# Patient Record
Sex: Female | Born: 1935 | Race: White | Hispanic: No | Marital: Married
Health system: Southern US, Community
[De-identification: ages and names within clinical notes are randomized; demographics above are authoritative.]

---

## 2003-12-06 ENCOUNTER — Ambulatory Visit: Payer: Self-pay | Admitting: Internal Medicine

## 2005-01-28 ENCOUNTER — Ambulatory Visit: Payer: Self-pay | Admitting: Internal Medicine

## 2006-04-09 ENCOUNTER — Ambulatory Visit: Payer: Self-pay | Admitting: Gastroenterology

## 2007-07-28 ENCOUNTER — Ambulatory Visit: Payer: Self-pay | Admitting: Physician Assistant

## 2008-11-17 ENCOUNTER — Ambulatory Visit: Payer: Self-pay | Admitting: Rheumatology

## 2008-11-22 ENCOUNTER — Ambulatory Visit: Payer: Self-pay | Admitting: Rheumatology

## 2008-12-18 ENCOUNTER — Ambulatory Visit: Payer: Self-pay

## 2009-01-04 ENCOUNTER — Ambulatory Visit: Payer: Self-pay | Admitting: Unknown Physician Specialty

## 2009-01-26 ENCOUNTER — Ambulatory Visit: Payer: Self-pay | Admitting: Unknown Physician Specialty

## 2009-08-06 ENCOUNTER — Ambulatory Visit: Payer: Self-pay | Admitting: Internal Medicine

## 2009-08-09 ENCOUNTER — Inpatient Hospital Stay: Payer: Self-pay | Admitting: Internal Medicine

## 2009-09-12 ENCOUNTER — Ambulatory Visit: Payer: Self-pay

## 2009-09-19 ENCOUNTER — Ambulatory Visit: Payer: Self-pay

## 2009-09-26 ENCOUNTER — Ambulatory Visit: Payer: Self-pay

## 2009-10-03 ENCOUNTER — Ambulatory Visit: Payer: Self-pay

## 2009-10-10 ENCOUNTER — Ambulatory Visit: Payer: Self-pay

## 2009-10-17 ENCOUNTER — Ambulatory Visit: Payer: Self-pay

## 2009-10-19 ENCOUNTER — Other Ambulatory Visit: Payer: Self-pay | Admitting: Internal Medicine

## 2009-10-25 ENCOUNTER — Ambulatory Visit: Payer: Self-pay | Admitting: Internal Medicine

## 2009-10-29 ENCOUNTER — Ambulatory Visit: Payer: Self-pay | Admitting: Internal Medicine

## 2009-11-05 ENCOUNTER — Ambulatory Visit: Payer: Self-pay | Admitting: Internal Medicine

## 2009-11-12 ENCOUNTER — Inpatient Hospital Stay: Payer: Self-pay | Admitting: Internal Medicine

## 2009-11-15 ENCOUNTER — Encounter: Payer: Self-pay | Admitting: Internal Medicine

## 2009-11-20 ENCOUNTER — Ambulatory Visit: Payer: Self-pay | Admitting: Internal Medicine

## 2009-11-28 ENCOUNTER — Inpatient Hospital Stay: Payer: Self-pay | Admitting: Specialist

## 2010-03-14 ENCOUNTER — Inpatient Hospital Stay: Payer: Self-pay | Admitting: Internal Medicine

## 2010-06-11 ENCOUNTER — Inpatient Hospital Stay: Payer: Self-pay | Admitting: Internal Medicine

## 2011-01-29 ENCOUNTER — Ambulatory Visit: Payer: Self-pay | Admitting: Internal Medicine

## 2011-02-14 ENCOUNTER — Inpatient Hospital Stay: Payer: Self-pay | Admitting: Internal Medicine

## 2011-02-14 LAB — COMPREHENSIVE METABOLIC PANEL
Albumin: 3.3 g/dL — ABNORMAL LOW (ref 3.4–5.0)
Anion Gap: 8 (ref 7–16)
BUN: 15 mg/dL (ref 7–18)
Bilirubin,Total: 0.3 mg/dL (ref 0.2–1.0)
Co2: 37 mmol/L — ABNORMAL HIGH (ref 21–32)
Creatinine: 1.16 mg/dL (ref 0.60–1.30)
EGFR (African American): 57 — ABNORMAL LOW
EGFR (Non-African Amer.): 47 — ABNORMAL LOW
Glucose: 167 mg/dL — ABNORMAL HIGH (ref 65–99)
Osmolality: 291 (ref 275–301)
SGPT (ALT): 13 U/L

## 2011-02-14 LAB — CK TOTAL AND CKMB (NOT AT ARMC): CK, Total: 45 U/L (ref 21–215)

## 2011-02-14 LAB — URINALYSIS, COMPLETE
Blood: NEGATIVE
Glucose,UR: NEGATIVE mg/dL (ref 0–75)
Nitrite: NEGATIVE
Ph: 7 (ref 4.5–8.0)
Squamous Epithelial: 3
WBC UR: <1 /HPF (ref 0–5)

## 2011-02-14 LAB — PROTIME-INR: INR: 1.8

## 2011-02-14 LAB — CBC
HCT: 38.8 % (ref 35.0–47.0)
HGB: 12.7 g/dL (ref 12.0–16.0)
MCH: 27.5 pg (ref 26.0–34.0)
MCV: 84 fL (ref 80–100)
Platelet: 225 10*3/uL (ref 150–440)
RBC: 4.64 10*6/uL (ref 3.80–5.20)
WBC: 8.1 10*3/uL (ref 3.6–11.0)

## 2011-02-16 LAB — CBC WITH DIFFERENTIAL/PLATELET
Eosinophil #: 0 10*3/uL (ref 0.0–0.7)
Eosinophil %: 0 %
HCT: 33.8 % — ABNORMAL LOW (ref 35.0–47.0)
Lymphocyte #: 0.4 10*3/uL — ABNORMAL LOW (ref 1.0–3.6)
MCHC: 32.6 g/dL (ref 32.0–36.0)
MCV: 85 fL (ref 80–100)
Monocyte #: 0.2 10*3/uL (ref 0.0–0.7)
Monocyte %: 1.6 %
Neutrophil %: 94.7 %
Platelet: 194 10*3/uL (ref 150–440)
RBC: 4 10*6/uL (ref 3.80–5.20)
RDW: 14.3 % (ref 11.5–14.5)
WBC: 10.5 10*3/uL (ref 3.6–11.0)

## 2011-02-16 LAB — BASIC METABOLIC PANEL
Anion Gap: 8 (ref 7–16)
BUN: 23 mg/dL — ABNORMAL HIGH (ref 7–18)
Calcium, Total: 8.7 mg/dL (ref 8.5–10.1)
Chloride: 99 mmol/L (ref 98–107)
Co2: 33 mmol/L — ABNORMAL HIGH (ref 21–32)
EGFR (African American): >60
Glucose: 168 mg/dL — ABNORMAL HIGH (ref 65–99)
Osmolality: 287 (ref 275–301)

## 2011-02-18 LAB — CBC WITH DIFFERENTIAL/PLATELET
Basophil #: 0 10*3/uL (ref 0.0–0.1)
Basophil %: 0.1 %
Eosinophil #: 0 10*3/uL (ref 0.0–0.7)
Eosinophil %: 0.1 %
HGB: 11.5 g/dL — ABNORMAL LOW (ref 12.0–16.0)
Lymphocyte %: 10.5 %
MCH: 27.9 pg (ref 26.0–34.0)
MCHC: 33.2 g/dL (ref 32.0–36.0)
MCV: 84 fL (ref 80–100)
Monocyte #: 0.8 10*3/uL — ABNORMAL HIGH (ref 0.0–0.7)
Neutrophil %: 81.3 %
Platelet: 191 10*3/uL (ref 150–440)
RDW: 14.1 % (ref 11.5–14.5)

## 2011-02-18 LAB — BASIC METABOLIC PANEL
Anion Gap: 10 (ref 7–16)
BUN: 28 mg/dL — ABNORMAL HIGH (ref 7–18)
Chloride: 98 mmol/L (ref 98–107)
Co2: 34 mmol/L — ABNORMAL HIGH (ref 21–32)
Osmolality: 289 (ref 275–301)

## 2011-02-18 LAB — PROTIME-INR: Prothrombin Time: 30.9 secs — ABNORMAL HIGH (ref 11.5–14.7)

## 2011-02-20 LAB — CULTURE, BLOOD (SINGLE)

## 2011-03-30 ENCOUNTER — Inpatient Hospital Stay: Payer: Self-pay | Admitting: Internal Medicine

## 2011-03-30 LAB — COMPREHENSIVE METABOLIC PANEL
Albumin: 3.5 g/dL (ref 3.4–5.0)
Anion Gap: 9 (ref 7–16)
Bilirubin,Total: 0.3 mg/dL (ref 0.2–1.0)
Chloride: 100 mmol/L (ref 98–107)
EGFR (Non-African Amer.): 60 — ABNORMAL LOW
Osmolality: 289 (ref 275–301)
Total Protein: 7.4 g/dL (ref 6.4–8.2)

## 2011-03-30 LAB — CBC WITH DIFFERENTIAL/PLATELET
Basophil #: 0 10*3/uL (ref 0.0–0.1)
Basophil %: 0.4 %
Eosinophil #: 0.1 10*3/uL (ref 0.0–0.7)
HGB: 11.8 g/dL — ABNORMAL LOW (ref 12.0–16.0)
Lymphocyte #: 1.3 10*3/uL (ref 1.0–3.6)
MCHC: 33.2 g/dL (ref 32.0–36.0)
MCV: 84 fL (ref 80–100)
Monocyte #: 0.9 10*3/uL — ABNORMAL HIGH (ref 0.0–0.7)
Monocyte %: 10.1 %
Neutrophil %: 73.6 %
WBC: 9.1 10*3/uL (ref 3.6–11.0)

## 2011-03-30 LAB — PROTIME-INR
INR: 1.3
Prothrombin Time: 16.6 secs — ABNORMAL HIGH (ref 11.5–14.7)

## 2011-03-30 LAB — APTT: Activated PTT: 29.9 secs (ref 23.6–35.9)

## 2011-03-30 LAB — TROPONIN I
Troponin-I: 0.03 ng/mL
Troponin-I: <0.02 ng/mL

## 2011-03-31 LAB — URINALYSIS, COMPLETE
Bacteria: NONE SEEN
Bilirubin,UR: NEGATIVE
Blood: NEGATIVE
Ketone: NEGATIVE
Nitrite: NEGATIVE
Specific Gravity: 1.01 (ref 1.003–1.030)
Squamous Epithelial: 1
WBC UR: NONE SEEN /HPF (ref 0–5)

## 2011-03-31 LAB — TROPONIN I: Troponin-I: <0.02 ng/mL

## 2011-03-31 LAB — CBC WITH DIFFERENTIAL/PLATELET
Basophil %: 0.2 %
Eosinophil #: 0 10*3/uL (ref 0.0–0.7)
HCT: 31.9 % — ABNORMAL LOW (ref 35.0–47.0)
HGB: 10.5 g/dL — ABNORMAL LOW (ref 12.0–16.0)
Lymphocyte %: 8.5 %
MCH: 27.4 pg (ref 26.0–34.0)
MCHC: 32.9 g/dL (ref 32.0–36.0)
MCV: 84 fL (ref 80–100)
Monocyte %: 2.2 %
Neutrophil %: 89.1 %
RBC: 3.82 10*6/uL (ref 3.80–5.20)
RDW: 15.9 % — ABNORMAL HIGH (ref 11.5–14.5)

## 2011-03-31 LAB — PROTIME-INR
INR: 1.4
Prothrombin Time: 17.3 secs — ABNORMAL HIGH (ref 11.5–14.7)

## 2011-03-31 LAB — COMPREHENSIVE METABOLIC PANEL
Albumin: 2.9 g/dL — ABNORMAL LOW (ref 3.4–5.0)
Alkaline Phosphatase: 98 U/L (ref 50–136)
Anion Gap: 7 (ref 7–16)
Bilirubin,Total: 0.3 mg/dL (ref 0.2–1.0)
Calcium, Total: 8.1 mg/dL — ABNORMAL LOW (ref 8.5–10.1)
Chloride: 101 mmol/L (ref 98–107)
Co2: 35 mmol/L — ABNORMAL HIGH (ref 21–32)
Creatinine: 0.91 mg/dL (ref 0.60–1.30)
Osmolality: 292 (ref 275–301)
Potassium: 4.3 mmol/L (ref 3.5–5.1)
SGOT(AST): 22 U/L (ref 15–37)
SGPT (ALT): 10 U/L — ABNORMAL LOW

## 2011-04-01 LAB — CBC WITH DIFFERENTIAL/PLATELET
Basophil #: 0 10*3/uL (ref 0.0–0.1)
Basophil %: 0 %
Eosinophil #: 0 10*3/uL (ref 0.0–0.7)
HCT: 34.4 % — ABNORMAL LOW (ref 35.0–47.0)
HGB: 11.4 g/dL — ABNORMAL LOW (ref 12.0–16.0)
Lymphocyte #: 0.4 10*3/uL — ABNORMAL LOW (ref 1.0–3.6)
MCH: 28 pg (ref 26.0–34.0)
Monocyte #: 0.4 10*3/uL (ref 0.0–0.7)
Monocyte %: 4 %
Neutrophil #: 8.5 10*3/uL — ABNORMAL HIGH (ref 1.4–6.5)
Platelet: 218 10*3/uL (ref 150–440)
RDW: 15.6 % — ABNORMAL HIGH (ref 11.5–14.5)

## 2011-04-01 LAB — BASIC METABOLIC PANEL
Anion Gap: 8 (ref 7–16)
BUN: 29 mg/dL — ABNORMAL HIGH (ref 7–18)
Calcium, Total: 8.3 mg/dL — ABNORMAL LOW (ref 8.5–10.1)
Chloride: 98 mmol/L (ref 98–107)
Creatinine: 1.13 mg/dL (ref 0.60–1.30)
Glucose: 171 mg/dL — ABNORMAL HIGH (ref 65–99)
Osmolality: 291 (ref 275–301)
Potassium: 4.2 mmol/L (ref 3.5–5.1)

## 2011-04-01 LAB — PROTIME-INR
INR: 1.9
Prothrombin Time: 22 secs — ABNORMAL HIGH (ref 11.5–14.7)

## 2011-04-02 LAB — CBC WITH DIFFERENTIAL/PLATELET
Basophil #: 0 10*3/uL (ref 0.0–0.1)
Eosinophil #: 0 10*3/uL (ref 0.0–0.7)
Eosinophil %: 0 %
HGB: 11.5 g/dL — ABNORMAL LOW (ref 12.0–16.0)
Lymphocyte #: 0.3 10*3/uL — ABNORMAL LOW (ref 1.0–3.6)
Lymphocyte %: 2.8 %
MCH: 27.6 pg (ref 26.0–34.0)
MCHC: 33.1 g/dL (ref 32.0–36.0)
Monocyte %: 3.1 %
Neutrophil #: 9.8 10*3/uL — ABNORMAL HIGH (ref 1.4–6.5)
Neutrophil %: 94 %
RBC: 4.16 10*6/uL (ref 3.80–5.20)
WBC: 10.4 10*3/uL (ref 3.6–11.0)

## 2011-04-02 LAB — BASIC METABOLIC PANEL
Anion Gap: 9 (ref 7–16)
BUN: 35 mg/dL — ABNORMAL HIGH (ref 7–18)
Calcium, Total: 8.5 mg/dL (ref 8.5–10.1)
EGFR (African American): >60
Glucose: 173 mg/dL — ABNORMAL HIGH (ref 65–99)
Osmolality: 293 (ref 275–301)
Sodium: 141 mmol/L (ref 136–145)

## 2011-04-02 LAB — URINE CULTURE

## 2011-04-02 LAB — EXPECTORATED SPUTUM ASSESSMENT W GRAM STAIN, RFLX TO RESP C

## 2011-04-02 LAB — PROTIME-INR: INR: 2.5

## 2011-04-03 LAB — BASIC METABOLIC PANEL
BUN: 37 mg/dL — ABNORMAL HIGH (ref 7–18)
Calcium, Total: 8.6 mg/dL (ref 8.5–10.1)
Co2: 36 mmol/L — ABNORMAL HIGH (ref 21–32)
Creatinine: 0.96 mg/dL (ref 0.60–1.30)
EGFR (Non-African Amer.): 58 — ABNORMAL LOW
Potassium: 3.6 mmol/L (ref 3.5–5.1)
Sodium: 141 mmol/L (ref 136–145)

## 2011-04-03 LAB — CBC WITH DIFFERENTIAL/PLATELET
Eosinophil #: 0 10*3/uL (ref 0.0–0.7)
Lymphocyte #: 0.8 10*3/uL — ABNORMAL LOW (ref 1.0–3.6)
Lymphocyte %: 8.3 %
MCH: 27.7 pg (ref 26.0–34.0)
MCV: 84 fL (ref 80–100)
Monocyte #: 0.7 10*3/uL (ref 0.0–0.7)
Neutrophil #: 8.1 10*3/uL — ABNORMAL HIGH (ref 1.4–6.5)
Platelet: 228 10*3/uL (ref 150–440)
RBC: 4.22 10*6/uL (ref 3.80–5.20)
WBC: 9.7 10*3/uL (ref 3.6–11.0)

## 2011-04-05 LAB — CULTURE, BLOOD (SINGLE)

## 2011-05-02 ENCOUNTER — Inpatient Hospital Stay: Payer: Self-pay | Admitting: Internal Medicine

## 2011-05-02 LAB — COMPREHENSIVE METABOLIC PANEL
Albumin: 3.1 g/dL — ABNORMAL LOW (ref 3.4–5.0)
Alkaline Phosphatase: 72 U/L (ref 50–136)
Anion Gap: 8 (ref 7–16)
Chloride: 100 mmol/L (ref 98–107)
Creatinine: 1 mg/dL (ref 0.60–1.30)
EGFR (African American): 59 — ABNORMAL LOW
EGFR (Non-African Amer.): 51 — ABNORMAL LOW
Potassium: 3.7 mmol/L (ref 3.5–5.1)
Sodium: 141 mmol/L (ref 136–145)
Total Protein: 7.1 g/dL (ref 6.4–8.2)

## 2011-05-02 LAB — PROTIME-INR
INR: 1.4
Prothrombin Time: 17.3 secs — ABNORMAL HIGH (ref 11.5–14.7)

## 2011-05-02 LAB — CBC
MCH: 26.8 pg (ref 26.0–34.0)
MCV: 85 fL (ref 80–100)
Platelet: 248 10*3/uL (ref 150–440)
RBC: 4.26 10*6/uL (ref 3.80–5.20)
RDW: 15 % — ABNORMAL HIGH (ref 11.5–14.5)
WBC: 10 10*3/uL (ref 3.6–11.0)

## 2011-05-03 LAB — BASIC METABOLIC PANEL
Anion Gap: 8 (ref 7–16)
BUN: 27 mg/dL — ABNORMAL HIGH (ref 7–18)
Calcium, Total: 8.6 mg/dL (ref 8.5–10.1)
Chloride: 102 mmol/L (ref 98–107)
Creatinine: 1.27 mg/dL (ref 0.60–1.30)
EGFR (African American): 44 — ABNORMAL LOW
EGFR (Non-African Amer.): 38 — ABNORMAL LOW
Glucose: 188 mg/dL — ABNORMAL HIGH (ref 65–99)
Osmolality: 297 (ref 275–301)
Potassium: 3.8 mmol/L (ref 3.5–5.1)
Sodium: 144 mmol/L (ref 136–145)

## 2011-05-03 LAB — CBC WITH DIFFERENTIAL/PLATELET
Basophil #: 0 10*3/uL (ref 0.0–0.1)
Basophil %: 0.1 %
Eosinophil %: 0.3 %
HGB: 9.8 g/dL — ABNORMAL LOW (ref 12.0–16.0)
Lymphocyte #: 1.7 10*3/uL (ref 1.0–3.6)
Lymphocyte %: 7.7 %
MCHC: 32.2 g/dL (ref 32.0–36.0)
MCV: 84 fL (ref 80–100)
Neutrophil #: 19.7 10*3/uL — ABNORMAL HIGH (ref 1.4–6.5)
RBC: 3.63 10*6/uL — ABNORMAL LOW (ref 3.80–5.20)
RDW: 15.2 % — ABNORMAL HIGH (ref 11.5–14.5)
WBC: 22.6 10*3/uL — ABNORMAL HIGH (ref 3.6–11.0)

## 2011-05-03 LAB — MAGNESIUM: Magnesium: 1.3 mg/dL — ABNORMAL LOW

## 2011-05-04 LAB — CBC WITH DIFFERENTIAL/PLATELET
Basophil #: 0 10*3/uL (ref 0.0–0.1)
Basophil %: 0.1 %
Eosinophil #: 0 10*3/uL (ref 0.0–0.7)
Lymphocyte #: 0.4 10*3/uL — ABNORMAL LOW (ref 1.0–3.6)
Lymphocyte %: 2.8 %
MCH: 27.4 pg (ref 26.0–34.0)
MCHC: 33.1 g/dL (ref 32.0–36.0)
MCV: 83 fL (ref 80–100)
Monocyte %: 1.5 %
Neutrophil %: 95.6 %
Platelet: 170 10*3/uL (ref 150–440)
RBC: 3.28 10*6/uL — ABNORMAL LOW (ref 3.80–5.20)
RDW: 15.1 % — ABNORMAL HIGH (ref 11.5–14.5)
WBC: 15.8 10*3/uL — ABNORMAL HIGH (ref 3.6–11.0)

## 2011-05-04 LAB — PROTIME-INR: Prothrombin Time: 16.7 secs — ABNORMAL HIGH (ref 11.5–14.7)

## 2011-05-04 LAB — BASIC METABOLIC PANEL
BUN: 35 mg/dL — ABNORMAL HIGH (ref 7–18)
Calcium, Total: 8.3 mg/dL — ABNORMAL LOW (ref 8.5–10.1)
Creatinine: 1.43 mg/dL — ABNORMAL HIGH (ref 0.60–1.30)
EGFR (Non-African Amer.): 33 — ABNORMAL LOW
Glucose: 160 mg/dL — ABNORMAL HIGH (ref 65–99)
Potassium: 4 mmol/L (ref 3.5–5.1)

## 2011-05-04 LAB — MAGNESIUM: Magnesium: 2.5 mg/dL — ABNORMAL HIGH

## 2011-05-05 LAB — BASIC METABOLIC PANEL
Anion Gap: 6 — ABNORMAL LOW (ref 7–16)
Calcium, Total: 8.3 mg/dL — ABNORMAL LOW (ref 8.5–10.1)
Co2: 31 mmol/L (ref 21–32)
EGFR (African American): 52 — ABNORMAL LOW
Osmolality: 291 (ref 275–301)
Potassium: 3.8 mmol/L (ref 3.5–5.1)
Sodium: 142 mmol/L (ref 136–145)

## 2011-05-05 LAB — CBC WITH DIFFERENTIAL/PLATELET
Basophil %: 0.1 %
Eosinophil #: 0 10*3/uL (ref 0.0–0.7)
Eosinophil %: 0 %
HCT: 24.8 % — ABNORMAL LOW (ref 35.0–47.0)
Lymphocyte %: 3.1 %
MCH: 27 pg (ref 26.0–34.0)
MCHC: 32.5 g/dL (ref 32.0–36.0)
Monocyte #: 0.6 x10 3/mm (ref 0.2–0.9)
Monocyte %: 5 %
Neutrophil #: 11.7 10*3/uL — ABNORMAL HIGH (ref 1.4–6.5)
Neutrophil %: 91.8 %

## 2011-05-06 LAB — CBC WITH DIFFERENTIAL/PLATELET
Basophil #: 0 10*3/uL (ref 0.0–0.1)
Eosinophil #: 0 10*3/uL (ref 0.0–0.7)
Eosinophil %: 0 %
HGB: 7.5 g/dL — ABNORMAL LOW (ref 12.0–16.0)
Lymphocyte #: 0.3 10*3/uL — ABNORMAL LOW (ref 1.0–3.6)
Lymphocyte %: 2.9 %
MCH: 28 pg (ref 26.0–34.0)
MCHC: 33.6 g/dL (ref 32.0–36.0)
Neutrophil #: 8.4 10*3/uL — ABNORMAL HIGH (ref 1.4–6.5)
Neutrophil %: 92.4 %
Platelet: 148 10*3/uL — ABNORMAL LOW (ref 150–440)
RBC: 2.69 10*6/uL — ABNORMAL LOW (ref 3.80–5.20)
RDW: 15.3 % — ABNORMAL HIGH (ref 11.5–14.5)

## 2011-05-06 LAB — BASIC METABOLIC PANEL
BUN: 20 mg/dL — ABNORMAL HIGH (ref 7–18)
Chloride: 107 mmol/L (ref 98–107)
Co2: 27 mmol/L (ref 21–32)
Creatinine: 1.03 mg/dL (ref 0.60–1.30)
Osmolality: 289 (ref 275–301)
Potassium: 4 mmol/L (ref 3.5–5.1)
Sodium: 141 mmol/L (ref 136–145)

## 2011-05-07 LAB — BASIC METABOLIC PANEL
Anion Gap: 7 (ref 7–16)
BUN: 20 mg/dL — ABNORMAL HIGH (ref 7–18)
Calcium, Total: 8.2 mg/dL — ABNORMAL LOW (ref 8.5–10.1)
Chloride: 104 mmol/L (ref 98–107)
Co2: 30 mmol/L (ref 21–32)
Creatinine: 0.89 mg/dL (ref 0.60–1.30)
EGFR (Non-African Amer.): 58 — ABNORMAL LOW
Glucose: 166 mg/dL — ABNORMAL HIGH (ref 65–99)
Potassium: 3.7 mmol/L (ref 3.5–5.1)
Sodium: 141 mmol/L (ref 136–145)

## 2011-05-07 LAB — CBC WITH DIFFERENTIAL/PLATELET
Basophil #: 0 10*3/uL (ref 0.0–0.1)
Basophil %: 0.4 %
Eosinophil #: 0 10*3/uL (ref 0.0–0.7)
HCT: 33.7 % — ABNORMAL LOW (ref 35.0–47.0)
HGB: 11.1 g/dL — ABNORMAL LOW (ref 12.0–16.0)
Lymphocyte #: 0.6 10*3/uL — ABNORMAL LOW (ref 1.0–3.6)
MCH: 27.6 pg (ref 26.0–34.0)
MCHC: 33.1 g/dL (ref 32.0–36.0)
MCV: 84 fL (ref 80–100)
Monocyte #: 0.7 x10 3/mm (ref 0.2–0.9)
Monocyte %: 6 %
Neutrophil %: 88.8 %
RBC: 4.03 10*6/uL (ref 3.80–5.20)
RDW: 15.4 % — ABNORMAL HIGH (ref 11.5–14.5)

## 2011-07-05 LAB — COMPREHENSIVE METABOLIC PANEL
Albumin: 3.9 g/dL (ref 3.4–5.0)
BUN: 18 mg/dL (ref 7–18)
Bilirubin,Total: 0.3 mg/dL (ref 0.2–1.0)
Calcium, Total: 9.7 mg/dL (ref 8.5–10.1)
Chloride: 100 mmol/L (ref 98–107)
Co2: 33 mmol/L — ABNORMAL HIGH (ref 21–32)
Creatinine: 1.13 mg/dL (ref 0.60–1.30)
EGFR (Non-African Amer.): 44 — ABNORMAL LOW
Glucose: 152 mg/dL — ABNORMAL HIGH (ref 65–99)
Osmolality: 286 (ref 275–301)
SGPT (ALT): 15 U/L
Sodium: 141 mmol/L (ref 136–145)
Total Protein: 8.2 g/dL (ref 6.4–8.2)

## 2011-07-05 LAB — CBC
HGB: 12.3 g/dL (ref 12.0–16.0)
RDW: 15.1 % — ABNORMAL HIGH (ref 11.5–14.5)
WBC: 14.3 10*3/uL — ABNORMAL HIGH (ref 3.6–11.0)

## 2011-07-05 LAB — TROPONIN I: Troponin-I: 0.04 ng/mL

## 2011-07-05 LAB — PROTIME-INR: INR: 0.9

## 2011-07-05 LAB — CK TOTAL AND CKMB (NOT AT ARMC): CK-MB: 1 ng/mL (ref 0.5–3.6)

## 2011-07-06 ENCOUNTER — Inpatient Hospital Stay: Payer: Self-pay | Admitting: Internal Medicine

## 2011-07-06 LAB — TROPONIN I: Troponin-I: 0.77 ng/mL — ABNORMAL HIGH

## 2011-07-07 LAB — CBC WITH DIFFERENTIAL/PLATELET
Basophil %: 0.1 %
Eosinophil #: 0 10*3/uL (ref 0.0–0.7)
HCT: 34.3 % — ABNORMAL LOW (ref 35.0–47.0)
MCHC: 32.4 g/dL (ref 32.0–36.0)
MCV: 86 fL (ref 80–100)
Neutrophil #: 7.1 10*3/uL — ABNORMAL HIGH (ref 1.4–6.5)
Neutrophil %: 90.5 %
RBC: 3.99 10*6/uL (ref 3.80–5.20)
RDW: 15.2 % — ABNORMAL HIGH (ref 11.5–14.5)

## 2011-07-07 LAB — BASIC METABOLIC PANEL
Anion Gap: 5 — ABNORMAL LOW (ref 7–16)
BUN: 30 mg/dL — ABNORMAL HIGH (ref 7–18)
Calcium, Total: 9.1 mg/dL (ref 8.5–10.1)
Co2: 34 mmol/L — ABNORMAL HIGH (ref 21–32)
EGFR (African American): 56 — ABNORMAL LOW
EGFR (Non-African Amer.): 48 — ABNORMAL LOW
Glucose: 173 mg/dL — ABNORMAL HIGH (ref 65–99)
Sodium: 138 mmol/L (ref 136–145)

## 2011-07-08 LAB — CBC WITH DIFFERENTIAL/PLATELET
Basophil #: 0 10*3/uL (ref 0.0–0.1)
Basophil %: 0.1 %
Eosinophil %: 0 %
Lymphocyte #: 0.5 10*3/uL — ABNORMAL LOW (ref 1.0–3.6)
MCH: 28.1 pg (ref 26.0–34.0)
MCHC: 32.2 g/dL (ref 32.0–36.0)
Monocyte #: 0.3 x10 3/mm (ref 0.2–0.9)
Monocyte %: 2.8 %
Neutrophil %: 91.9 %
Platelet: 198 10*3/uL (ref 150–440)
RBC: 3.92 10*6/uL (ref 3.80–5.20)

## 2011-07-08 LAB — URINALYSIS, COMPLETE
Bacteria: NONE SEEN
Bilirubin,UR: NEGATIVE
Glucose,UR: NEGATIVE mg/dL (ref 0–75)
Ketone: NEGATIVE
Nitrite: NEGATIVE
Specific Gravity: 1.021 (ref 1.003–1.030)
Squamous Epithelial: NONE SEEN
WBC UR: 53 /HPF (ref 0–5)

## 2011-07-08 LAB — BASIC METABOLIC PANEL
Calcium, Total: 8.3 mg/dL — ABNORMAL LOW (ref 8.5–10.1)
Chloride: 100 mmol/L (ref 98–107)
EGFR (Non-African Amer.): 51 — ABNORMAL LOW
Glucose: 170 mg/dL — ABNORMAL HIGH (ref 65–99)
Potassium: 4.7 mmol/L (ref 3.5–5.1)
Sodium: 139 mmol/L (ref 136–145)

## 2011-07-09 LAB — CBC WITH DIFFERENTIAL/PLATELET
Basophil #: 0 10*3/uL (ref 0.0–0.1)
HCT: 33 % — ABNORMAL LOW (ref 35.0–47.0)
Lymphocyte %: 7.5 %
MCHC: 32.1 g/dL (ref 32.0–36.0)
Monocyte #: 0.8 x10 3/mm (ref 0.2–0.9)
Monocyte %: 8.1 %
Neutrophil #: 7.8 10*3/uL — ABNORMAL HIGH (ref 1.4–6.5)
RDW: 15.4 % — ABNORMAL HIGH (ref 11.5–14.5)
WBC: 9.3 10*3/uL (ref 3.6–11.0)

## 2011-07-09 LAB — BASIC METABOLIC PANEL
Anion Gap: 5 — ABNORMAL LOW (ref 7–16)
BUN: 39 mg/dL — ABNORMAL HIGH (ref 7–18)
Chloride: 103 mmol/L (ref 98–107)
Creatinine: 1.17 mg/dL (ref 0.60–1.30)
EGFR (African American): 49 — ABNORMAL LOW
EGFR (Non-African Amer.): 42 — ABNORMAL LOW
Glucose: 133 mg/dL — ABNORMAL HIGH (ref 65–99)

## 2011-07-10 LAB — URINE CULTURE

## 2012-07-23 LAB — COMPREHENSIVE METABOLIC PANEL
Albumin: 2.8 g/dL — ABNORMAL LOW (ref 3.4–5.0)
Alkaline Phosphatase: 66 U/L (ref 50–136)
Anion Gap: 4 — ABNORMAL LOW (ref 7–16)
Bilirubin,Total: 0.5 mg/dL (ref 0.2–1.0)
Calcium, Total: 9.4 mg/dL (ref 8.5–10.1)
Co2: 35 mmol/L — ABNORMAL HIGH (ref 21–32)
Glucose: 100 mg/dL — ABNORMAL HIGH (ref 65–99)
Osmolality: 273 (ref 275–301)
Potassium: 3.8 mmol/L (ref 3.5–5.1)
SGOT(AST): 14 U/L — ABNORMAL LOW (ref 15–37)
Total Protein: 7.1 g/dL (ref 6.4–8.2)

## 2012-07-23 LAB — URINALYSIS, COMPLETE
Bilirubin,UR: NEGATIVE
Glucose,UR: NEGATIVE mg/dL (ref 0–75)
Hyaline Cast: 5
Leukocyte Esterase: NEGATIVE
Protein: 30
RBC,UR: 1 /HPF (ref 0–5)
Specific Gravity: 1.015 (ref 1.003–1.030)
Squamous Epithelial: <1
WBC UR: 1 /HPF (ref 0–5)

## 2012-07-23 LAB — CBC
HCT: 34.5 % — ABNORMAL LOW (ref 35.0–47.0)
MCH: 28.8 pg (ref 26.0–34.0)
MCHC: 33.6 g/dL (ref 32.0–36.0)

## 2012-07-24 ENCOUNTER — Inpatient Hospital Stay: Payer: Self-pay | Admitting: Specialist

## 2012-07-25 LAB — CBC WITH DIFFERENTIAL/PLATELET
Basophil #: 0 x10 3/mm 3
Basophil %: 0.1 %
Eosinophil #: 0 x10 3/mm 3
Eosinophil %: 0 %
HCT: 34.3 % — ABNORMAL LOW
HGB: 11.4 g/dL — ABNORMAL LOW
Lymphocyte %: 5.7 %
Lymphs Abs: 0.6 x10 3/mm 3 — ABNORMAL LOW
MCH: 28.6 pg
MCHC: 33.1 g/dL
MCV: 86 fL
Monocyte #: 0.5 "x10 3/mm "
Monocyte %: 4.2 %
Neutrophil #: 10 x10 3/mm 3 — ABNORMAL HIGH
Neutrophil %: 90 %
Platelet: 196 x10 3/mm 3
RBC: 3.98 X10 6/mm 3
RDW: 13.4 %
WBC: 11.1 x10 3/mm 3 — ABNORMAL HIGH

## 2012-07-25 LAB — BASIC METABOLIC PANEL WITH GFR
Anion Gap: 2 — ABNORMAL LOW
BUN: 31 mg/dL — ABNORMAL HIGH
Calcium, Total: 9 mg/dL
Chloride: 101 mmol/L
Co2: 34 mmol/L — ABNORMAL HIGH
Creatinine: 1.09 mg/dL
EGFR (African American): 52 — ABNORMAL LOW
EGFR (Non-African Amer.): 45 — ABNORMAL LOW
Glucose: 162 mg/dL — ABNORMAL HIGH
Osmolality: 284
Potassium: 3.9 mmol/L
Sodium: 137 mmol/L

## 2012-07-25 LAB — URINE CULTURE

## 2012-07-25 LAB — CLOSTRIDIUM DIFFICILE BY PCR

## 2012-07-28 LAB — CULTURE, BLOOD (SINGLE)

## 2012-08-06 ENCOUNTER — Ambulatory Visit: Payer: Self-pay | Admitting: Internal Medicine

## 2012-08-09 ENCOUNTER — Inpatient Hospital Stay: Payer: Self-pay | Admitting: Family Medicine

## 2012-08-09 LAB — CBC
HGB: 11 g/dL — ABNORMAL LOW (ref 12.0–16.0)
MCH: 29.2 pg (ref 26.0–34.0)
MCHC: 34 g/dL (ref 32.0–36.0)
Platelet: 263 10*3/uL (ref 150–440)
RDW: 13.8 % (ref 11.5–14.5)

## 2012-08-09 LAB — COMPREHENSIVE METABOLIC PANEL
Anion Gap: 2 — ABNORMAL LOW (ref 7–16)
Chloride: 96 mmol/L — ABNORMAL LOW (ref 98–107)
Co2: 39 mmol/L — ABNORMAL HIGH (ref 21–32)
Creatinine: 0.96 mg/dL (ref 0.60–1.30)
EGFR (Non-African Amer.): 53 — ABNORMAL LOW
Glucose: 131 mg/dL — ABNORMAL HIGH (ref 65–99)
Osmolality: 280 (ref 275–301)
Sodium: 137 mmol/L (ref 136–145)
Total Protein: 6.8 g/dL (ref 6.4–8.2)

## 2012-08-09 LAB — CK TOTAL AND CKMB (NOT AT ARMC)
CK, Total: 8 U/L — ABNORMAL LOW (ref 21–215)
CK-MB: <0.5 ng/mL — ABNORMAL LOW (ref 0.5–3.6)

## 2012-08-09 LAB — PRO B NATRIURETIC PEPTIDE: B-Type Natriuretic Peptide: 2017 pg/mL — ABNORMAL HIGH (ref 0–450)

## 2012-08-10 LAB — CBC WITH DIFFERENTIAL/PLATELET
Basophil %: 0.7 %
Eosinophil #: 0.1 10*3/uL (ref 0.0–0.7)
HGB: 10.4 g/dL — ABNORMAL LOW (ref 12.0–16.0)
Lymphocyte %: 8.5 %
MCHC: 34.1 g/dL (ref 32.0–36.0)
MCV: 86 fL (ref 80–100)
Monocyte #: 0.7 x10 3/mm (ref 0.2–0.9)
Monocyte %: 5.6 %
Neutrophil #: 10.4 10*3/uL — ABNORMAL HIGH (ref 1.4–6.5)
WBC: 12.2 10*3/uL — ABNORMAL HIGH (ref 3.6–11.0)

## 2012-08-10 LAB — BASIC METABOLIC PANEL
Anion Gap: 2 — ABNORMAL LOW (ref 7–16)
Co2: 37 mmol/L — ABNORMAL HIGH (ref 21–32)
EGFR (African American): >60
Osmolality: 277 (ref 275–301)
Sodium: 138 mmol/L (ref 136–145)

## 2012-08-11 LAB — CULTURE, BLOOD (SINGLE)

## 2012-08-12 LAB — BASIC METABOLIC PANEL
Anion Gap: 2 — ABNORMAL LOW (ref 7–16)
Chloride: 98 mmol/L (ref 98–107)
Co2: 37 mmol/L — ABNORMAL HIGH (ref 21–32)
Creatinine: 0.89 mg/dL (ref 0.60–1.30)

## 2012-08-12 LAB — CBC WITH DIFFERENTIAL/PLATELET
Basophil %: 0.1 %
Eosinophil #: 0 10*3/uL (ref 0.0–0.7)
Eosinophil %: 0 %
HCT: 31.5 % — ABNORMAL LOW (ref 35.0–47.0)
HGB: 10.6 g/dL — ABNORMAL LOW (ref 12.0–16.0)
Lymphocyte #: 0.3 10*3/uL — ABNORMAL LOW (ref 1.0–3.6)
MCH: 29.2 pg (ref 26.0–34.0)
MCHC: 33.6 g/dL (ref 32.0–36.0)
MCV: 87 fL (ref 80–100)
Monocyte #: 0.2 x10 3/mm (ref 0.2–0.9)
Neutrophil %: 95.8 %
RBC: 3.63 10*6/uL — ABNORMAL LOW (ref 3.80–5.20)

## 2012-08-12 LAB — VANCOMYCIN, TROUGH: Vancomycin, Trough: 8 ug/mL — ABNORMAL LOW (ref 10–20)

## 2012-08-13 LAB — CBC WITH DIFFERENTIAL/PLATELET
Eosinophil #: 0 10*3/uL (ref 0.0–0.7)
HCT: 31.9 % — ABNORMAL LOW (ref 35.0–47.0)
HGB: 10.6 g/dL — ABNORMAL LOW (ref 12.0–16.0)
MCH: 28.6 pg (ref 26.0–34.0)
MCHC: 33.4 g/dL (ref 32.0–36.0)
Monocyte #: 0.2 x10 3/mm (ref 0.2–0.9)
Monocyte %: 1.8 %
Neutrophil #: 11.7 10*3/uL — ABNORMAL HIGH (ref 1.4–6.5)
Neutrophil %: 95.5 %
RBC: 3.71 10*6/uL — ABNORMAL LOW (ref 3.80–5.20)
RDW: 14 % (ref 11.5–14.5)

## 2012-08-14 LAB — CREATININE, SERUM: Creatinine: 0.88 mg/dL (ref 0.60–1.30)

## 2012-08-14 LAB — VANCOMYCIN, TROUGH: Vancomycin, Trough: 16 ug/mL (ref 10–20)

## 2012-08-16 LAB — URINALYSIS, COMPLETE
Bilirubin,UR: NEGATIVE
Blood: NEGATIVE
Ketone: NEGATIVE
Nitrite: NEGATIVE
Ph: 6 (ref 4.5–8.0)
Protein: NEGATIVE
RBC,UR: 23 /HPF (ref 0–5)
Squamous Epithelial: 3

## 2012-08-16 LAB — CBC WITH DIFFERENTIAL/PLATELET
Basophil #: 0 10*3/uL (ref 0.0–0.1)
Eosinophil %: 1.1 %
HCT: 33 % — ABNORMAL LOW (ref 35.0–47.0)
HGB: 11 g/dL — ABNORMAL LOW (ref 12.0–16.0)
Lymphocyte #: 1.4 10*3/uL (ref 1.0–3.6)
MCH: 28.8 pg (ref 26.0–34.0)
MCHC: 33.5 g/dL (ref 32.0–36.0)
MCV: 86 fL (ref 80–100)
Neutrophil #: 11.2 10*3/uL — ABNORMAL HIGH (ref 1.4–6.5)
Neutrophil %: 83.9 %
Platelet: 282 10*3/uL (ref 150–440)
RBC: 3.84 10*6/uL (ref 3.80–5.20)
RDW: 14.4 % (ref 11.5–14.5)
WBC: 13.4 10*3/uL — ABNORMAL HIGH (ref 3.6–11.0)

## 2012-08-16 LAB — BASIC METABOLIC PANEL
Anion Gap: 0 — ABNORMAL LOW (ref 7–16)
BUN: 22 mg/dL — ABNORMAL HIGH (ref 7–18)
Calcium, Total: 9.1 mg/dL (ref 8.5–10.1)
Co2: 42 mmol/L (ref 21–32)
Creatinine: 0.75 mg/dL (ref 0.60–1.30)
EGFR (African American): >60
EGFR (Non-African Amer.): >60
Osmolality: 278 (ref 275–301)
Potassium: 4.1 mmol/L (ref 3.5–5.1)
Sodium: 137 mmol/L (ref 136–145)

## 2012-08-16 LAB — PROTEIN / CREATININE RATIO, URINE
Creatinine, Urine: 83.5 mg/dL (ref 30.0–125.0)
Protein, Random Urine: 31 mg/dL — ABNORMAL HIGH (ref 0–12)
Protein/Creat. Ratio: 371 mg/gCREAT — ABNORMAL HIGH (ref 0–200)

## 2012-08-16 LAB — CHLORIDE, URINE, RANDOM: Chloride, Urine Random: 117 mmol/L (ref 55–125)

## 2012-08-17 LAB — VANCOMYCIN, TROUGH: Vancomycin, Trough: 23 ug/mL (ref 10–20)

## 2012-08-17 LAB — CREATININE, SERUM
Creatinine: 0.87 mg/dL (ref 0.60–1.30)
EGFR (African American): >60
EGFR (Non-African Amer.): 59 — ABNORMAL LOW

## 2012-08-17 LAB — COMPREHENSIVE METABOLIC PANEL
Alkaline Phosphatase: 77 U/L (ref 50–136)
Anion Gap: 1 — ABNORMAL LOW (ref 7–16)
BUN: 20 mg/dL — ABNORMAL HIGH (ref 7–18)
Bilirubin,Total: 0.6 mg/dL (ref 0.2–1.0)
Calcium, Total: 9.2 mg/dL (ref 8.5–10.1)
Chloride: 94 mmol/L — ABNORMAL LOW (ref 98–107)
Co2: 41 mmol/L (ref 21–32)
Osmolality: 275 (ref 275–301)
Potassium: 3.9 mmol/L (ref 3.5–5.1)
SGPT (ALT): 14 U/L (ref 12–78)
Total Protein: 6.1 g/dL — ABNORMAL LOW (ref 6.4–8.2)

## 2012-08-17 LAB — CULTURE, BLOOD (SINGLE)

## 2012-08-18 LAB — COMPREHENSIVE METABOLIC PANEL
Albumin: 2.4 g/dL — ABNORMAL LOW (ref 3.4–5.0)
BUN: 18 mg/dL (ref 7–18)
Bilirubin,Total: 0.6 mg/dL (ref 0.2–1.0)
Calcium, Total: 9.2 mg/dL (ref 8.5–10.1)
EGFR (African American): >60
EGFR (Non-African Amer.): >60
Glucose: 111 mg/dL — ABNORMAL HIGH (ref 65–99)
Osmolality: 275 (ref 275–301)
Potassium: 3.9 mmol/L (ref 3.5–5.1)
Total Protein: 6.1 g/dL — ABNORMAL LOW (ref 6.4–8.2)

## 2012-08-18 LAB — PROTEIN ELECTROPHORESIS(ARMC)

## 2012-08-18 LAB — KAPPA/LAMBDA FREE LIGHT CHAINS (ARMC)

## 2012-08-19 LAB — VANCOMYCIN, TROUGH: Vancomycin, Trough: 11 ug/mL (ref 10–20)

## 2012-08-19 LAB — BASIC METABOLIC PANEL
Anion Gap: 1 — ABNORMAL LOW (ref 7–16)
Chloride: 99 mmol/L (ref 98–107)
EGFR (Non-African Amer.): >60
Glucose: 107 mg/dL — ABNORMAL HIGH (ref 65–99)
Osmolality: 281 (ref 275–301)
Sodium: 140 mmol/L (ref 136–145)

## 2012-08-19 LAB — CBC WITH DIFFERENTIAL/PLATELET
Basophil #: 0 10*3/uL (ref 0.0–0.1)
Basophil %: 0.4 %
Eosinophil #: 0.4 10*3/uL (ref 0.0–0.7)
Eosinophil %: 4.3 %
HCT: 28.8 % — ABNORMAL LOW (ref 35.0–47.0)
Lymphocyte #: 0.9 10*3/uL — ABNORMAL LOW (ref 1.0–3.6)
Lymphocyte %: 9.8 %
MCH: 29.2 pg (ref 26.0–34.0)
MCHC: 33.8 g/dL (ref 32.0–36.0)
MCV: 86 fL (ref 80–100)
Monocyte #: 0.7 x10 3/mm (ref 0.2–0.9)
Neutrophil #: 7.4 10*3/uL — ABNORMAL HIGH (ref 1.4–6.5)
Neutrophil %: 78.4 %
Platelet: 234 10*3/uL (ref 150–440)
RBC: 3.34 10*6/uL — ABNORMAL LOW (ref 3.80–5.20)
RDW: 14.5 % (ref 11.5–14.5)
WBC: 9.4 10*3/uL (ref 3.6–11.0)

## 2012-09-06 ENCOUNTER — Ambulatory Visit: Payer: Self-pay | Admitting: Internal Medicine

## 2012-09-06 DEATH — deceased

## 2014-02-22 IMAGING — CR DG FOOT COMPLETE 3+V*L*
1 series · 3 of 3 positions shown · non-contrast
Comparison: none

REASON FOR EXAM: left calcaneal swelling and erythema and tenderness
COMMENTS:

[Series 1: ap · 0.17mm/px · 3 of 3 slices shown]
[im 1/3]
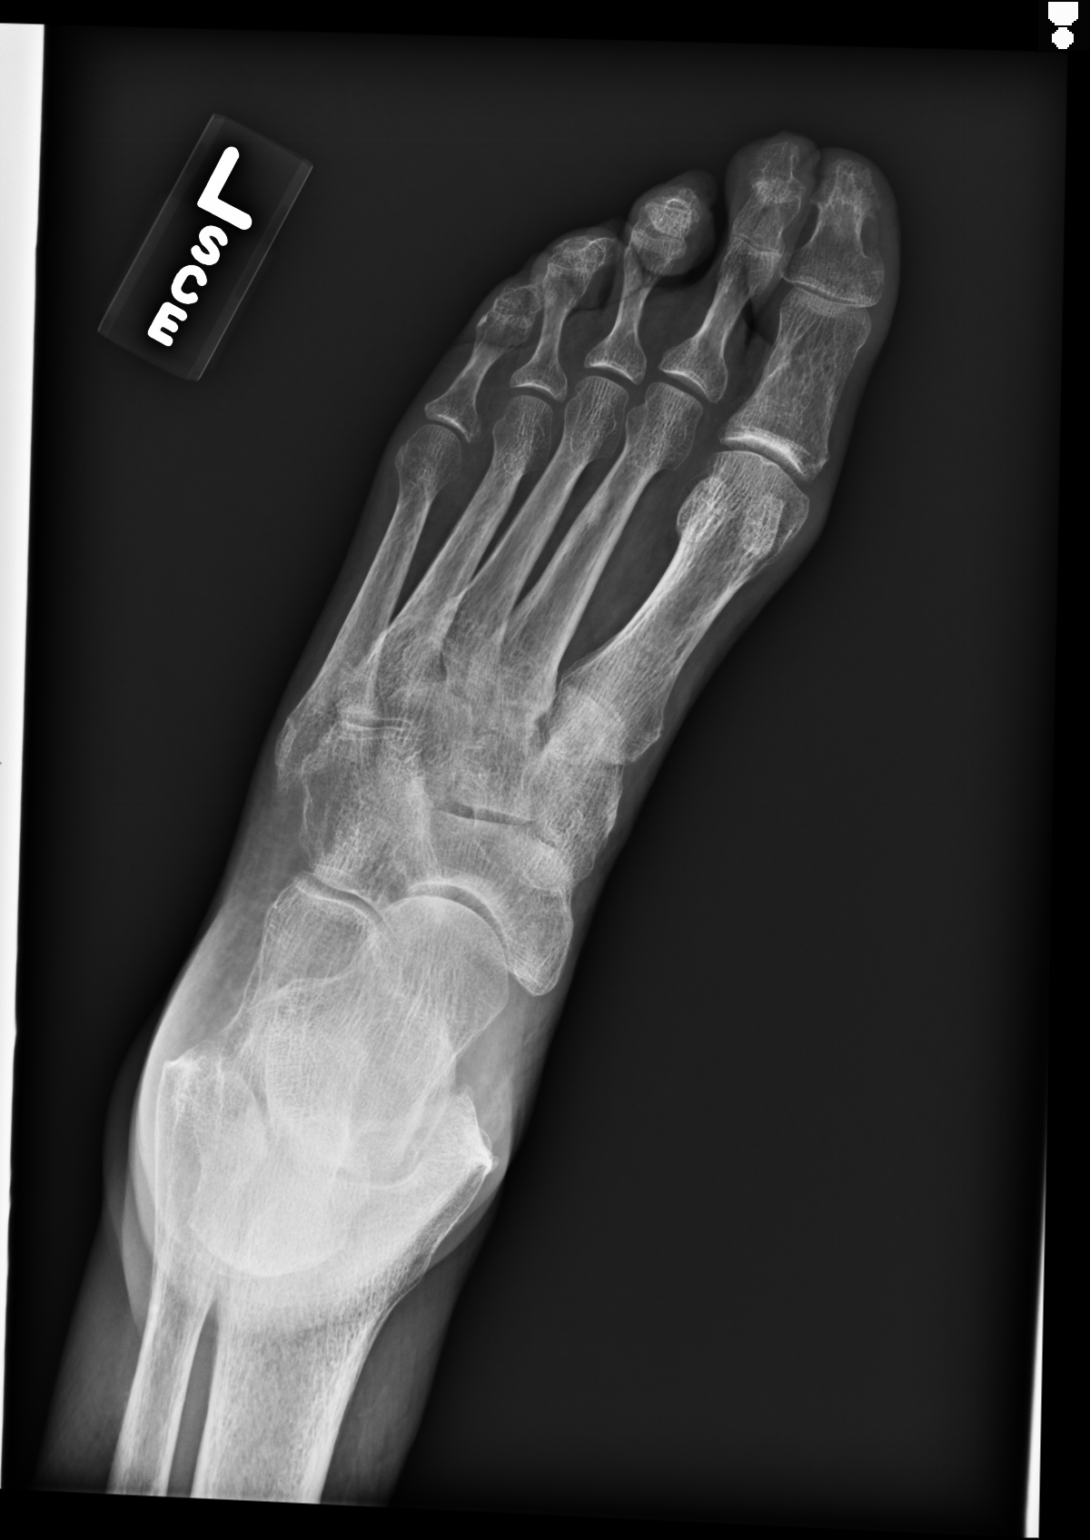
[im 2/3]
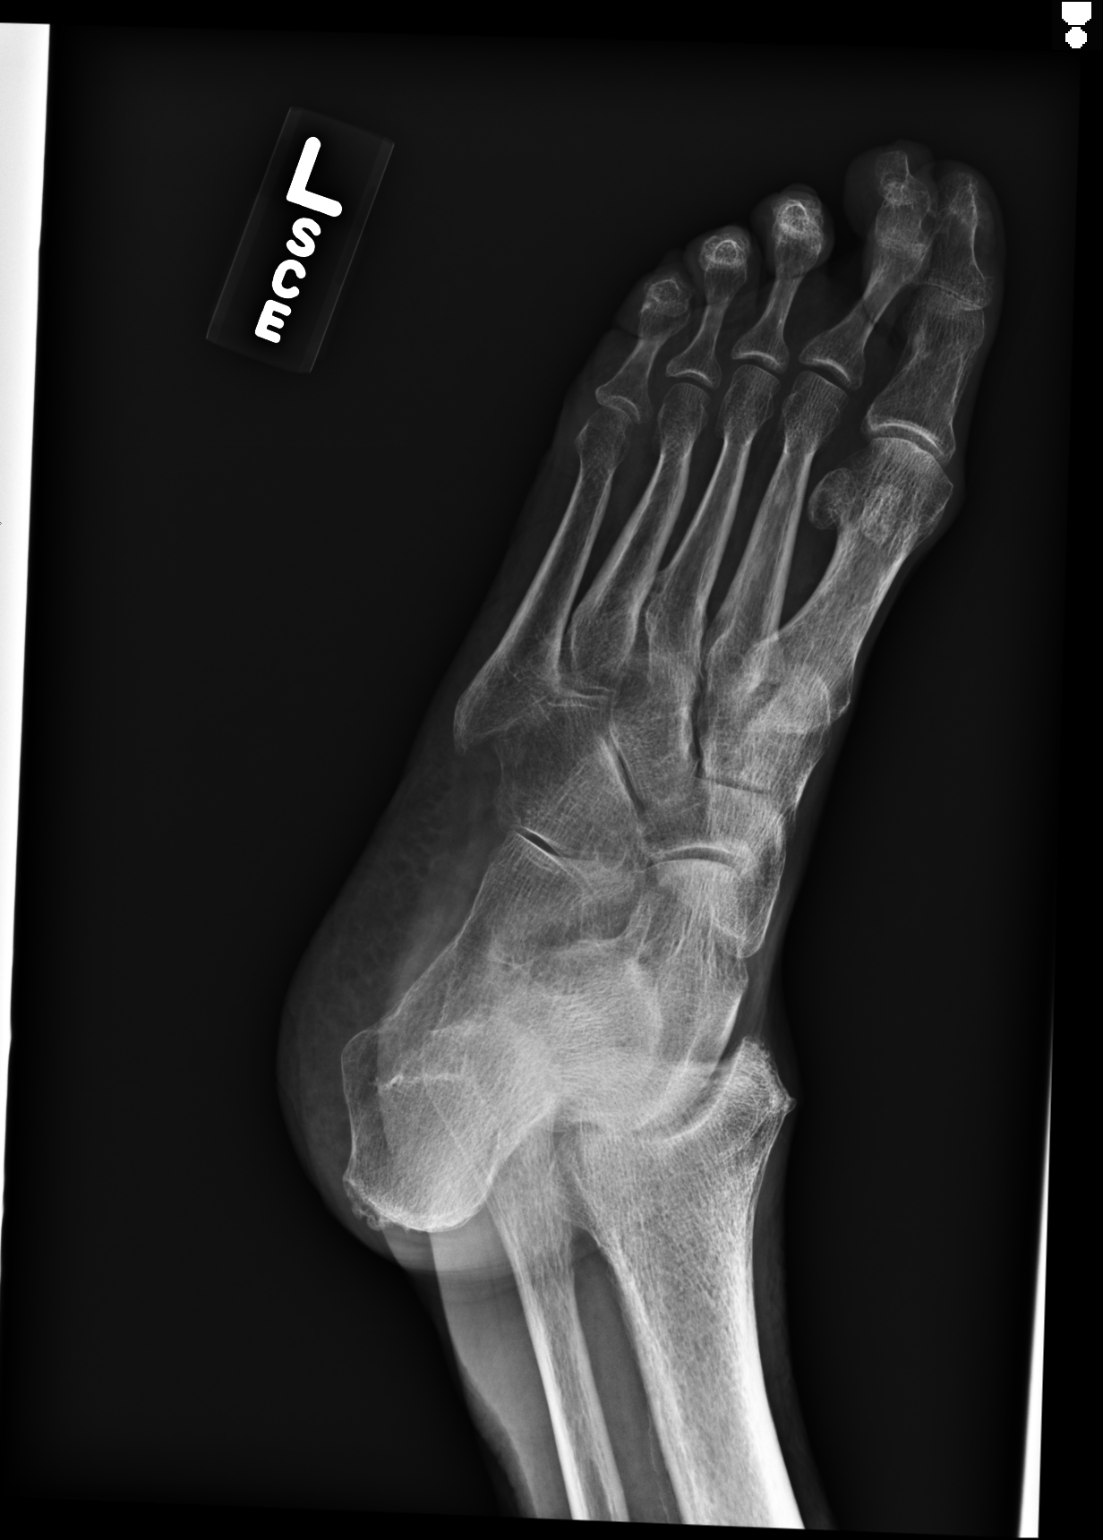
[im 3/3]
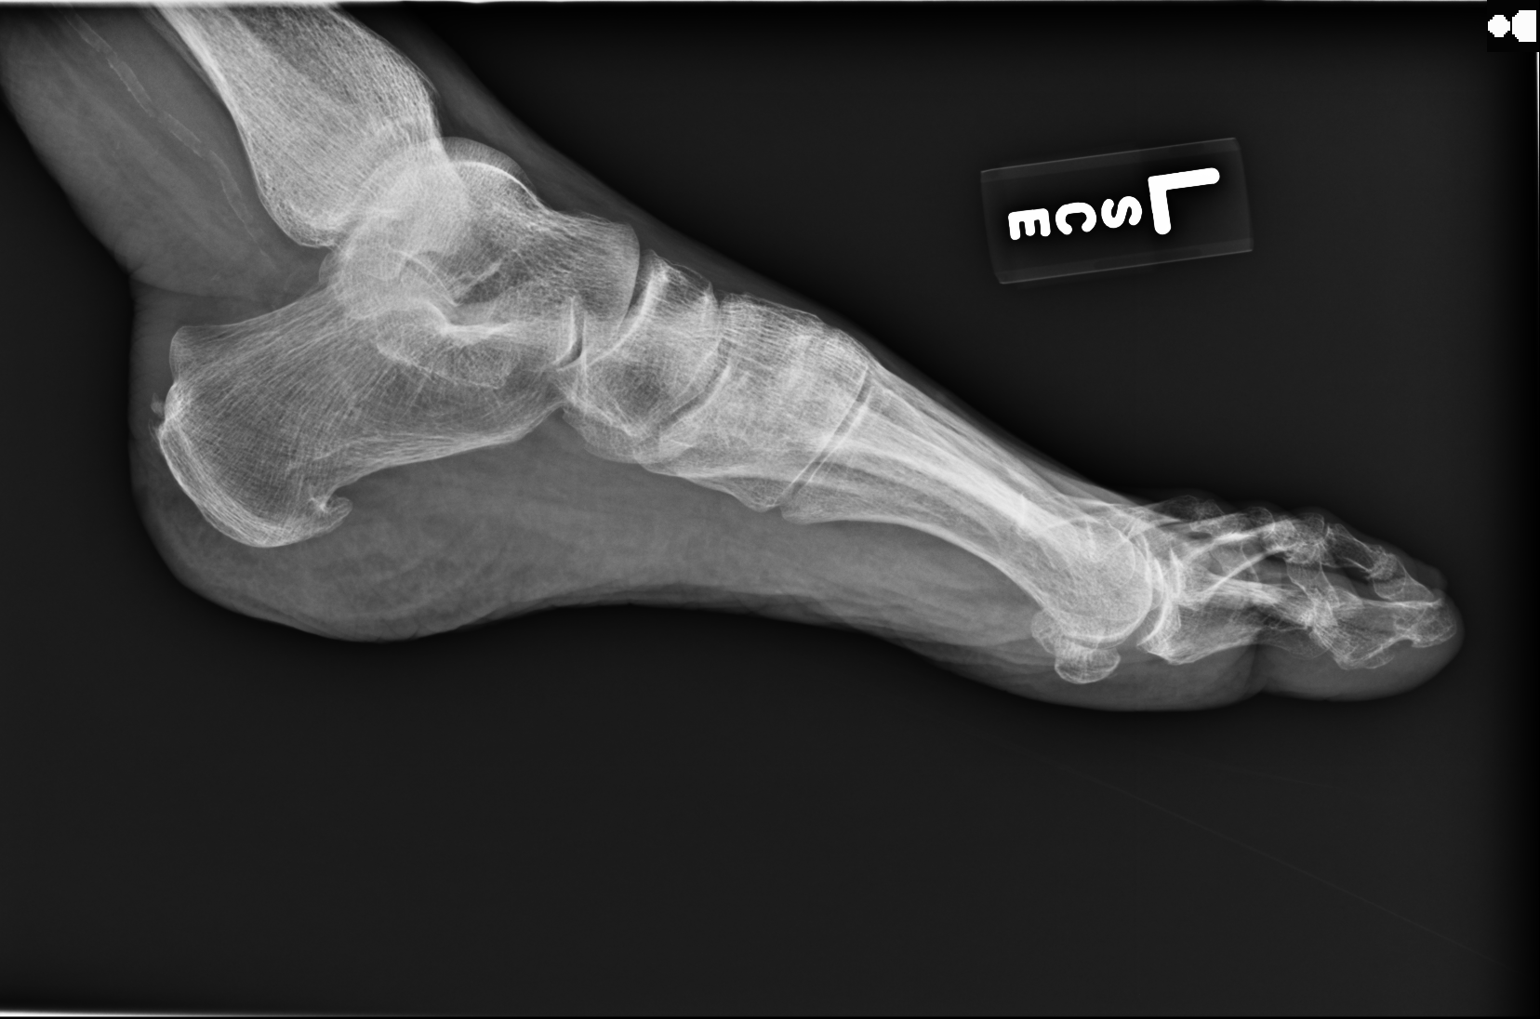

[3 of 3 positions shown; findings below may reference images not displayed]

PROCEDURE:     DXR - DXR FOOT LT COMP W/OBLIQUES  - August 10, 2012  [DATE]

RESULT:     Left foot images demonstrate enter phalangeal degenerative
changes. There are prominent metatarsal phalangeal and tarsometatarsal
degenerative changes present as well. There is no bony destruction or
fracture. Plantar spurring is seen in the calcaneus.
IMPRESSION: 1. Chronic changes. No acute bony abnormality evident.

[REDACTED]

## 2014-02-26 IMAGING — CR DG CHEST 1V PORT
1 series · 1 of 1 positions shown · non-contrast
Comparison: none

REASON FOR EXAM: insertion of PICC- right posterior oblique view
COMMENTS:

[ap]
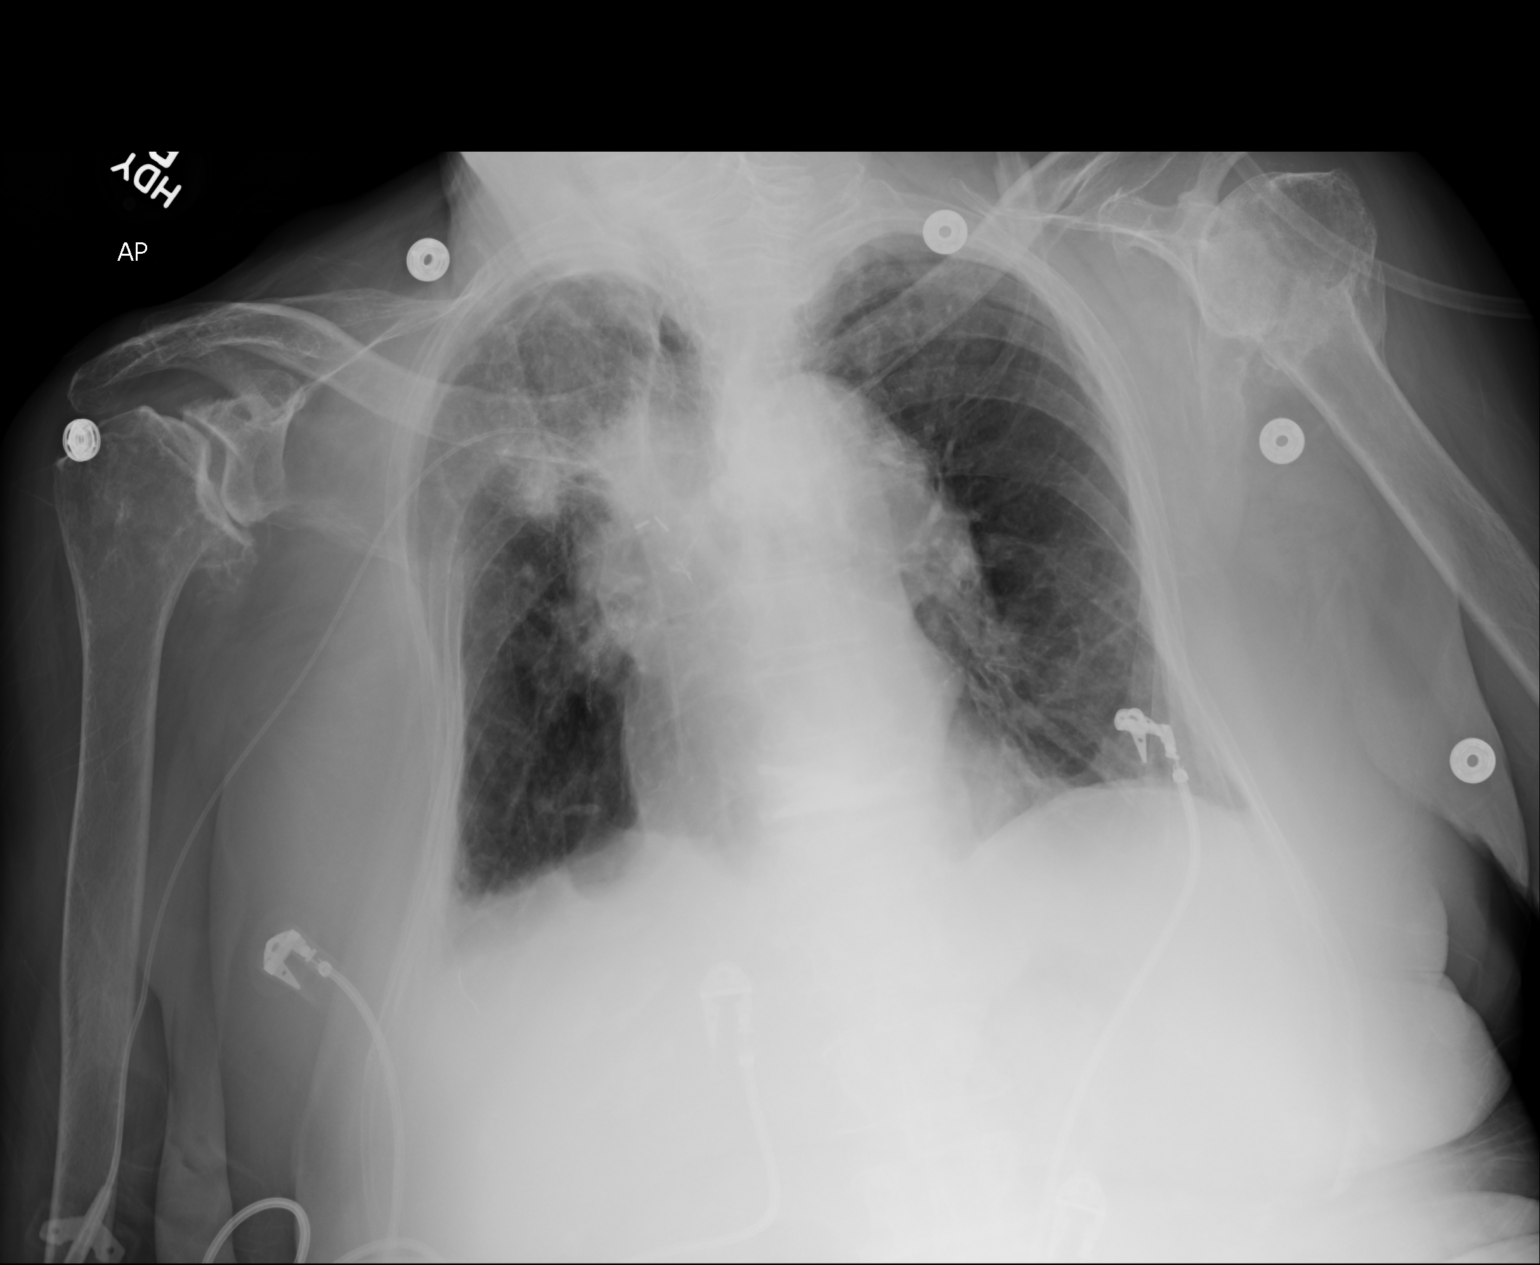

[1 of 1 positions shown; findings below may reference images not displayed]

PROCEDURE:     DXR - DXR PORTABLE CHEST SINGLE VIEW  - August 14, 2012  [DATE]

RESULT:     Comparison is made to the study August 14, 2012.

The lungs are slightly less well inflated today. There remains increased
density in the right upper hemithorax. The PICC line has been advanced such
that its tip now lies at the junction of the SVC with the right atrium.
Stable increased density in the right upper hemithorax is present. Chronic
abnormalities of both shoulders are present.
IMPRESSION: There has been interval advancement of the PICC line such
that its tip now lies in the region of the junction of the SVC with the
right atrium.

[REDACTED]

## 2014-02-27 IMAGING — CT CT HEAD WITHOUT CONTRAST
1 series · 15 of 30 positions shown, 19 images · non-contrast
Comparison: none

REASON FOR EXAM: drowsy
COMMENTS:

[Series 2: soft tissue · axial · 0.42mm/px · z∈[-130,+5]mm · 15 of 31 slices shown, 19 images]
[im 2/31  brain]
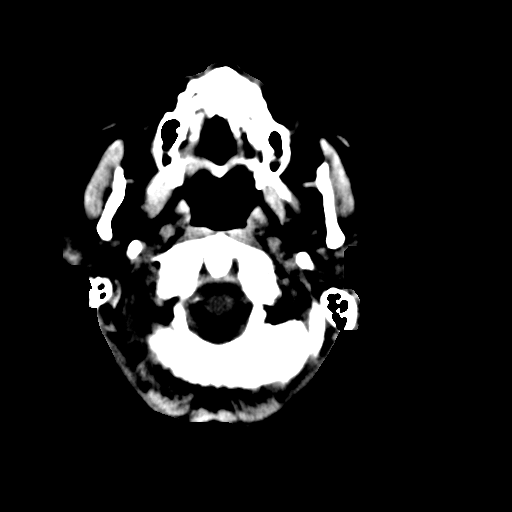
[im 2/31  bone]
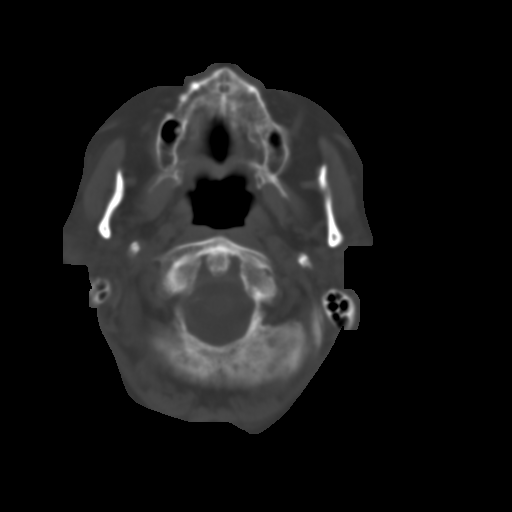
[im 4/31  brain]
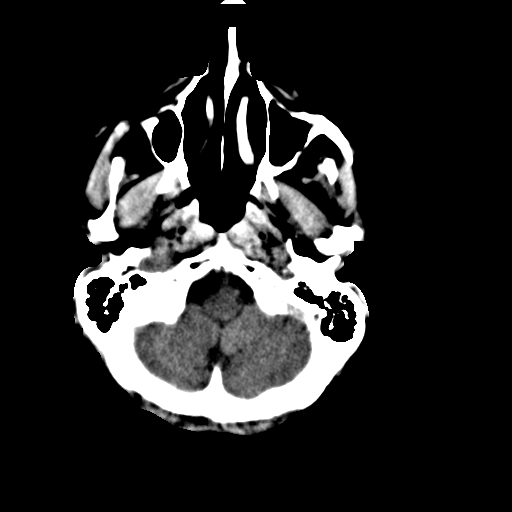
[im 6/31  brain]
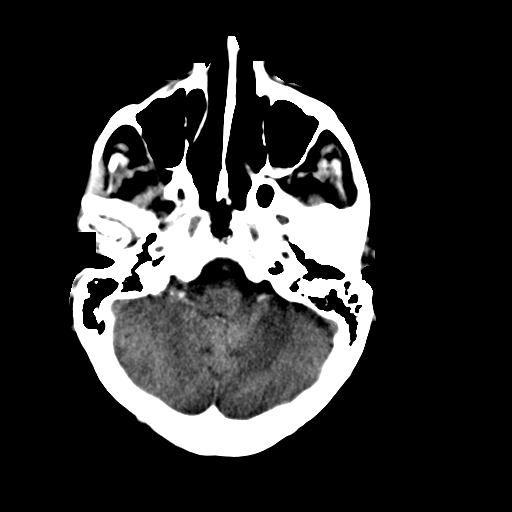
[im 8/31  brain]
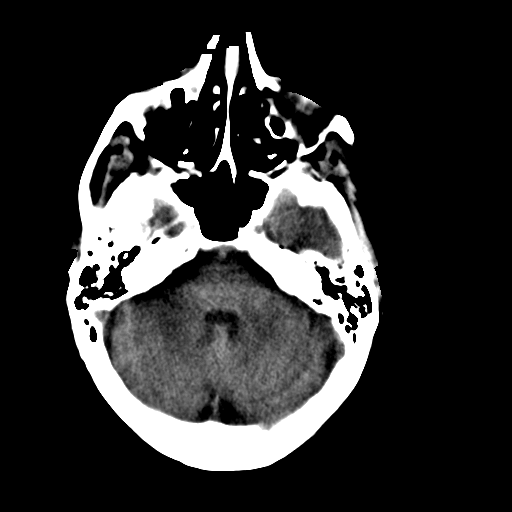
[im 10/31  brain]
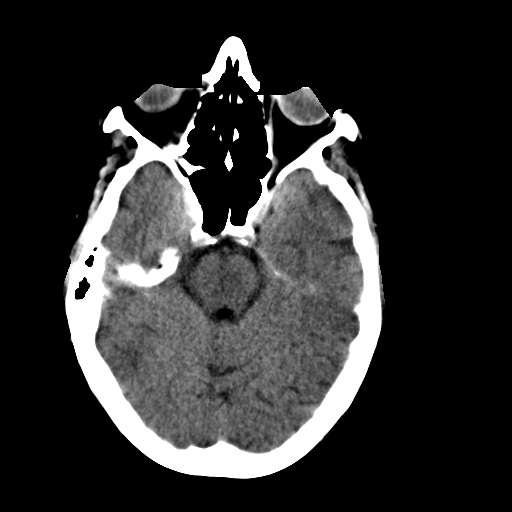
[im 10/31  bone]
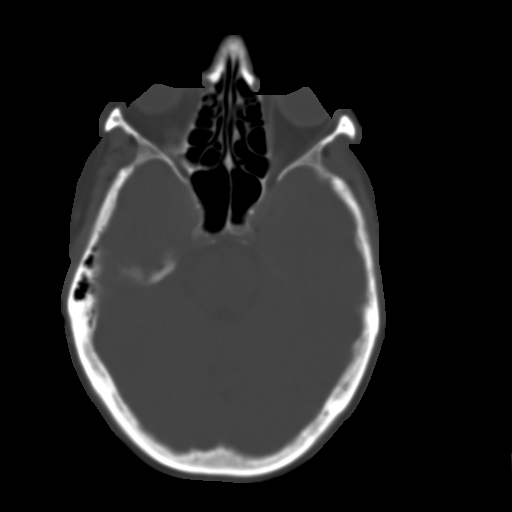
[im 12/31  brain]
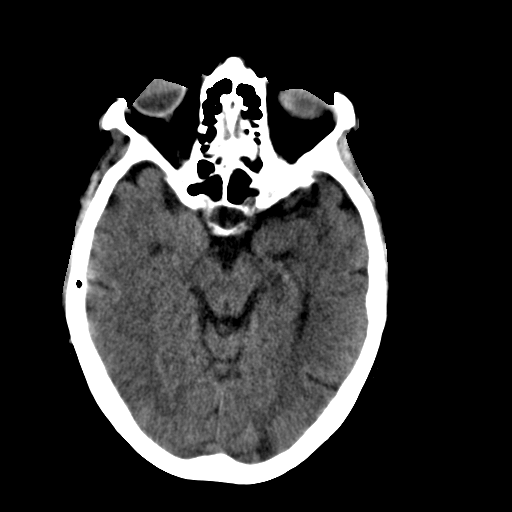
[im 14/31  brain]
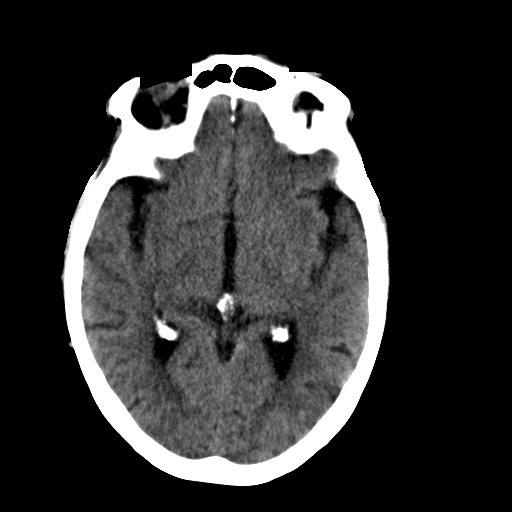
[im 16/31  brain]
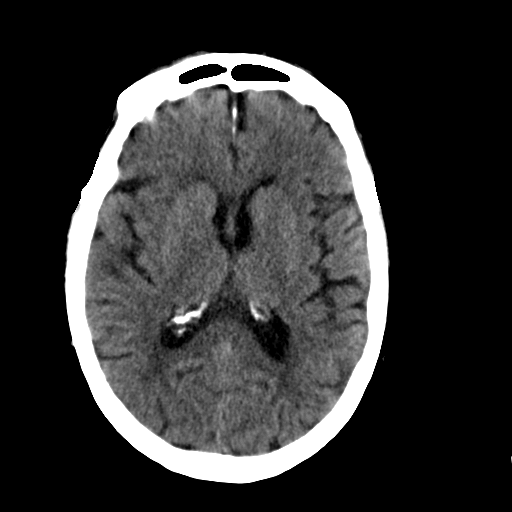
[im 17/31  brain]
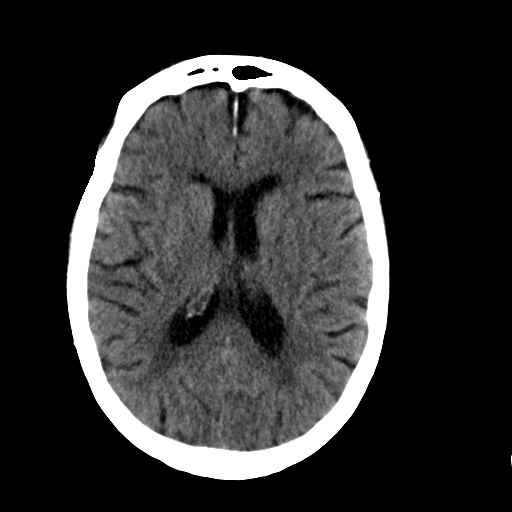
[im 17/31  bone]
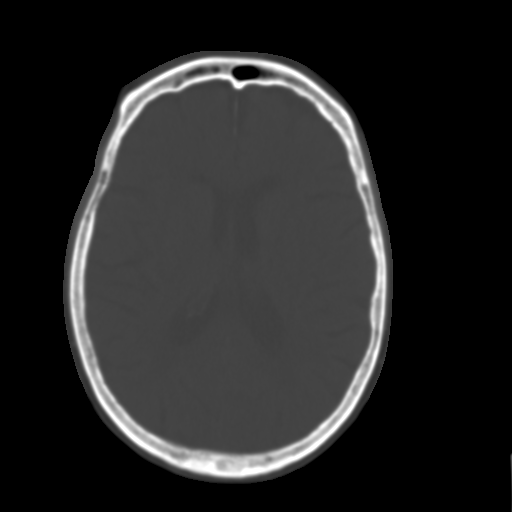
[im 19/31  brain]
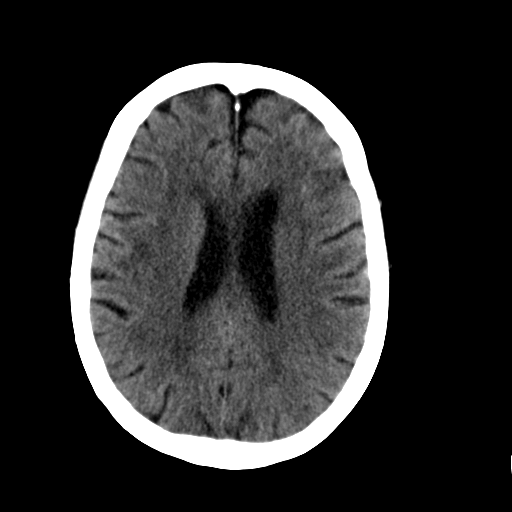
[im 21/31  brain]
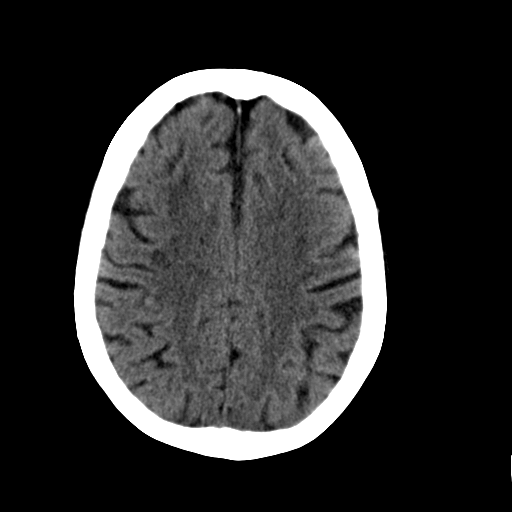
[im 23/31  brain]
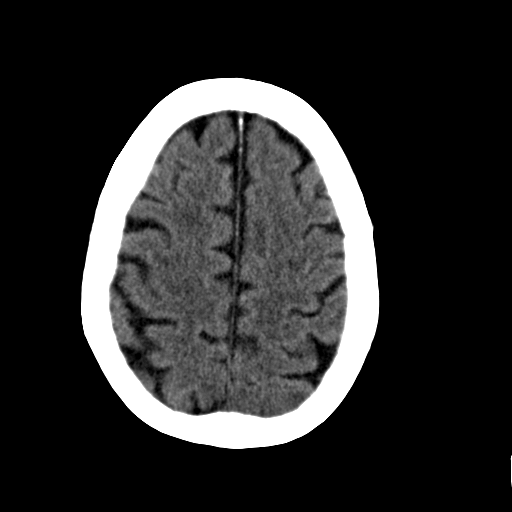
[im 25/31  brain]
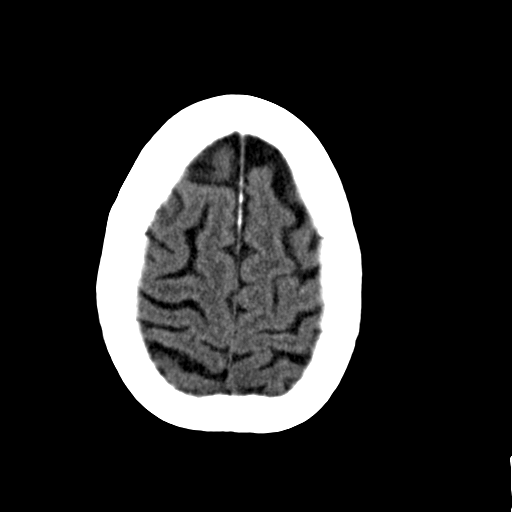
[im 25/31  bone]
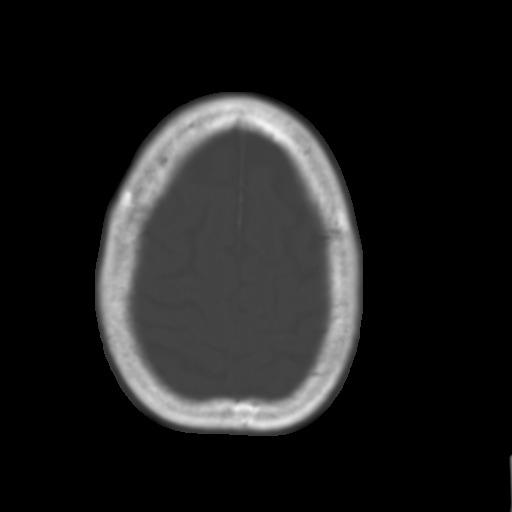
[im 27/31  brain]
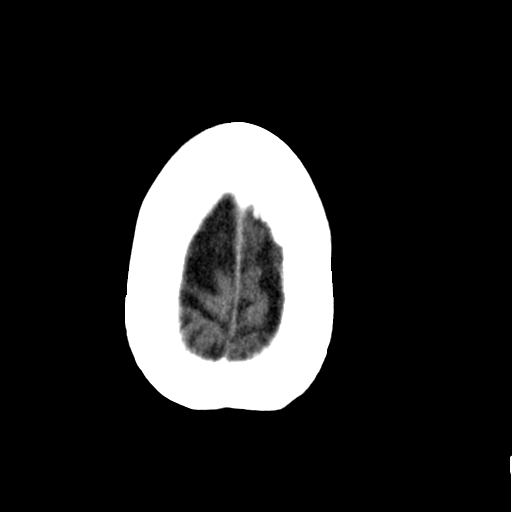
[im 29/31  brain]
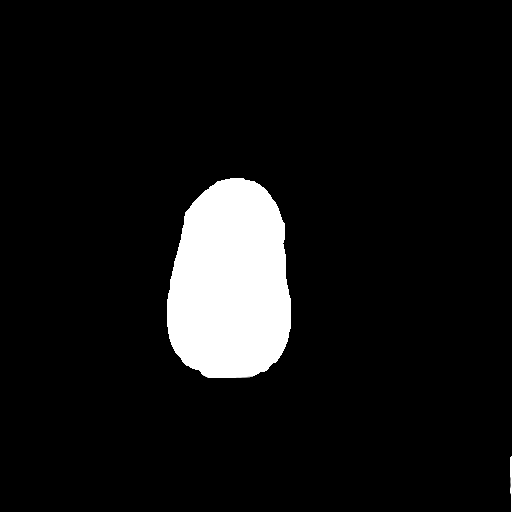

[15 of 30 positions shown; findings below may reference images not displayed]

PROCEDURE:     CT  - CT HEAD WITHOUT CONTRAST  - August 15, 2012  [DATE]

RESULT:     Axial noncontrast CT scanning was performed through the brain
with reconstructions at 5 mm intervals and slice thicknesses.

There is mild age-appropriate diffuse cerebral and cerebellar atrophy with
compensatory ventriculomegaly. There is no shift of the midline. There is
decreased density in the deep white matter of both cerebral hemispheres
consistent with chronic small vessel ischemic change. There is no evidence
of an acute ischemic or hemorrhagic infarction. There is evidence of an old
infarct in the left parietal lobe on image 13.

At bone window settings there is a small amount of mucoperiosteal thickening
and a trace of fluid posteriorly in the right maxillary sinus. The remainder
of the paranasal sinuses and mastoid air cells are clear. There is no
evidence of an acute skull fracture.
IMPRESSION: 1. There is no evidence of an acute ischemic or hemorrhagic infarction.
2. There is an old infarct in the left posterior parietal lobe. There is
decreased density in the deep white matter of both cerebral hemispheres
consistent with chronic small vessel ischemic type change.
3. There may be mild inflammatory change of the left maxillary sinus.

[REDACTED]

## 2014-04-28 NOTE — Discharge Summary (Signed)
PATIENT NAME:  Gina Potts, Gina Potts MR#:  [id number] DATE OF BIRTH:  1921-07-16  DATE OF ADMISSION:  07/24/2012 DATE OF DISCHARGE:  07/27/2012  For a detailed note, please take a look at the history and physical done on admission by Dr. Rudene Rearwish.   DISCHARGE DIAGNOSIS: 1.  Chronic obstructive pulmonary disease exacerbation, likely secondary to acute bronchitis. 2.  Acute bronchitis.  3.  Acute diarrhea, likely related to Levaquin, now resolved. 4.  History of lung cancer.  5.  Osteoarthritis.  6.  Diabetes.  7.  Anxiety.  8.  Hypertension.   DISCHARGE DIET: The patient is being discharged on a low-sodium, American Diabetic Association Diet.   DISCHARGE ACTIVITY: As tolerated.   DISCHARGE FOLLOWUP:  Is with Dr. Terance HartBronstein in the next 1 to 2 weeks.   DISCHARGE MEDICATIONS:  1.  Nitroglycerin patch 0.4 mg daily. 2.  DuoNebs q. 2 hours as needed. 3.  Ativan 0.5 mg q. 4 hours as needed for anxiety. 4.  Oxygen 3 liters nasal cannula continuously. 5.  Spiriva 1 puff daily. 6.  Singular 10 mg daily. 7.  Advair 500/50 one puff b.i.d. 8.  Prednisone 7.5 mg daily on Monday, Wednesday and Friday and prednisone 5 mg on Sunday, Tuesday, Thursday and Saturday. These are not to be initiated until she finishes her current prednisone taper. 9.  Potassium 10 mEq daily. 10.  Vitamin B12 1000 mcg daily. 11.  Calcium with vitamin D 1 tab b.i.d. 12.  Cozaar 100 mg daily. 13.  Metoprolol tartrate 75 mg b.i.d. 14.  Tylenol 650 mg q. 4 hours as needed.  15.  Vitamin D3 1000 international units daily. 16.  Voltaren topical gel t.i.d. to left hip. 17.  DuoNebs 4 times daily, scheduled. 18.  Prednisone taper starting at 60 mg down to 10 mg over the next 12 days. 19.  Dextromethorphan/guaifenesin 10 mL q. 4 hours as needed for cough. 20.  Zithromax 500 mg daily x 5 days.   PERTINENT STUDIES DONE DURING THE HOSPITAL COURSE: A chest x-ray done on admission showing no acute cardiopulmonary disease. Blood cultures  noted to be negative. Urine cultures are also negative. Stool for C. diff comprehensive culture also negative.   BRIEF HOSPITAL COURSE: This is an 81 year old female with medical problems as mentioned above who presented to the hospital due to her shortness of breath, cough and low-grade fever.  1.  Acute febrile illness. The patient presented to the hospital with shortness of breath, cough and low-grade fevers of 100.4. She had a chest x-ray done on admission which was negative for pneumonia. She was empirically started on IV Levaquin, also IV steroids, around-the-clock nebulizer treatments and maintained on inhalers. The likely cause of her febrile illness was probably acute bronchitis. She did not tolerate the Levaquin well. She developed some diarrhea. Therefore, it was switched to Zithromax. She is currently being discharged on a prednisone taper and Zithromax as stated.  She has been afebrile now for almost 48 hours with her blood cultures and urine cultures that are negative.  2.  Diarrhea. This was acute diarrhea, probably related to Levaquin. Her Levaquin was changed to Zithromax and she has had no further diarrhea. Her C. diff toxin and comprehensive cultures were negative.  3.  COPD exacerbation. This was likely secondary to her acute bronchitis. The patient was initially started on IV Solu-Medrol, also around-the-clock nebulizer treatments, maintained on Advair and Spiriva. She is clinically improved. She does have underlying lung cancer; therefore, she has chronic wheezing.  At this point, her O2 sats are stable on 3 liters which is what she is currently chronically on. She is being discharged on that along with her prednisone taper and Zithromax and nebulizer treatments along with her Advair and Spiriva.  4.  Hypertension. The patient remained hemodynamically stable. She will continue her metoprolol and losartan. 5.  Osteoporosis. The patient will continue her calcium and vitamin D  supplements. 6.  Acute renal failure. This was likely secondary to dehydration and poor p.o. intake. This has improved and resolved now with IV fluid hydration. The patient was on Lasix, which I am discontinuing for now as her p.o. intake is still fairly poor.   The patient is a DNI/DNR. She is being discharged back to her skilled nursing facility, at [facility].   TIME SPENT ON DISCHARGE: 40 minutes. ____________________________ Rolly Pancake. Cherlynn Kaiser, MD vjs:sb D: 07/27/2012 15:23:57 ET T: 07/27/2012 15:42:15 ET JOB#: [id number]  cc: Rolly Pancake. Cherlynn Kaiser, MD, <Dictator> Teena Irani. Terance Hart, MD Houston Siren MD ELECTRONICALLY SIGNED 08/07/2012 15:14

## 2014-04-28 NOTE — Consult Note (Signed)
PATIENT NAME:  Gina Potts, Forever H MR#:  [id number] DATE OF BIRTH:  07-Feb-1921  DATE OF CONSULTATION:  08/17/2012  REASON FOR ADMISSION: Worsening pneumonia and MSSA bacteremia.    CONSULTING PHYSICIAN:  Dr. Elisabeth PigeonVachhani.   HISTORY OF PRESENT ILLNESS: This is an 81 year old woman with COPD on home O2 who has had several recent admissions. She was admitted at this time August 4 with shortness of breath, cough with sputum and feverish feeling. She was found to have a white count of 16.9 and a chest x-ray with right upper lobe pneumonia. The patient was admitted, started on vancomycin meropenem and levofloxacin. Since that time her blood cultures have turned positive for MSSA. However, her respiratory status has not improved much and she has continued to have worsening findings on her CAT scan. We are consulted for further antibiotic management.   Currently, the patient says she is very fatigued. She is having some shortness of breath. She thinks she is having some cough. She denies any choking on her food, although she says she has no appetite.   PAST MEDICAL HISTORY: 1.  COPD.  2.  Chronic respiratory failure on 3 liters O2.  3.  History lung cancer status post resection.  4.  History of pulmonary embolism.  5.  Proximal atrial fibrillation.  6.  Diabetes type 2.  7.  Osteoarthritis.  8.  Osteoporosis.  9.  Hyperlipidemia.  10.  Compression fracture.   PAST SURGICAL HISTORY: Right upper lung lobectomy.   SOCIAL HISTORY: The patient is a former smoker. She quit in 1986. She does not drink or use drugs. She is widowed and lives [facility].   FAMILY HISTORY: Positive for hypertension, coronary artery disease.   REVIEW OF SYSTEMS:  Is limited due to the patient being somewhat sedated. However, negative except as per history of present illness.   CURRENT MEDICATIONS: Antibiotic review-  the patient has received since admission: Vancomycin from August 6 to current, Levofloxacin from August 7 to  August 11 and meropenem on August 6. Other medications include: Acetaminophen, calcium carbonate, vitamin D3, B12, guaifenesin, Colace, Advair, subcutaneous heparin, sliding scale insulin, Ativan, metoprolol, Singulair, ondansetron, pantoprazole, potassium chloride, prednisone, Senna, Spiriva, metoprolol, nystatin topical powder,  nystatin oral suspension as well as nebulizers.   ALLERGIES: SULFA, KEFLEX, PENICILLIN, DOXYCYCLINE. It is unclear what the allergies are to these.   PHYSICAL EXAMINATION: VITAL SIGNS: T-max last 24 hours 99.2, pulse 75, blood pressure 121/65, sat 96% on 3 liters, respirations 18.  GENERAL: She is fatigued, lying quietly in bed.  HEENT: Pupils equal, round and reactive to light and accommodation. Extraocular movements are intact. Sclerae are anicteric. Oropharynx: Mucous membranes are dry. She has some aphthous ulcerations over her tongue.  NECK: Supple.  HEART: Regular, no murmurs, rubs, or gallops.  LUNGS: She has poor breath sounds bilaterally. Her right side has rhonchi as well.  ABDOMEN: Soft, nontender, nondistended. No hepatosplenomegaly.  EXTREMITIES: No clubbing, cyanosis or edema.  SKIN: She has multiple bruising and ecchymoses.  NEUROLOGIC: She is alert and interactive, but quite sedated. She can move all 4 extremities.   DIAGNOSTIC DATA: Microbiology: Blood cultures on August 4 are growing MSSA sensitive to oxacillin, gentamicin, linezolid, tigecycline. Resistant to clindamycin, ciprofloxacin, erythromycin, and levofloxacin. Sputum culture August 5 grew heavy growth of Candida albicans. Follow-up blood culture August 7, negative x2. Urine culture August 11, small colonies that are too small to read. CT scan done August 12 and compared with August 5 and July 06, 2011  reveals this patient is status post right upper lobectomy. Persistent increased density in the right lower lobe slightly improved compared to the most recent studies. Air bronchograms are present.  There is pleural or parenchymal thickening in the right hemithorax. There is a small right pleural effusion. The consolidation extends to the right hilar region. There is borderline to mildly enlarged subcarinal adenopathy present. There is some irregular density in the lingula, which appears to be stable and this is likely fibrosis. No endobronchial lesions were noted.   IMPRESSION: An 81 year old female with advanced chronic obstructive pulmonary disease, on home O2 with a severe pneumonia as well as methicillin-sensitive Staphylococcus aureus  bacteremia. Sputum cultures are negative. Her blood cultures seem to have cleared. She is ALLERGIC TO PENICILLIN AND CEPHALOSPORINS.   I wonder if part of her pulmonary process is aspiration given her sedation and altered mental status. The right lower lobe area suggests that is a possibility. Currently she has not been covered for anaerobes except for the meropenem which she received. She did have an echocardiogram done, which does not show any evidence of endocarditis, although it was a TTE.   RECOMMENDATIONS: 1.  I would suggest we cover her for aspiration pneumonia. Since she is ALLERGIC TO PENICILLINS, we could do this with moxifloxacin and I have put in an order for this.  2.  For MSSA bacteremia, given her allergies, it would be best to continue  her on vancomycin. She would likely need at least a 4-week course of this. We do not actually have a source for it although it would potentially be from the lungs.  3.  I would suggest if they have not seen her already, a speech and swallow evaluation to evaluate her for any aspiration risk.  Thank you for the consult. I will be glad to follow with you.    ____________________________ Stann Mainland. Sampson Goon, MD dpf:dp D: 08/17/2012 13:51:00 ET T: 08/17/2012 14:16:47 ET JOB#: [id number]  cc: Onalee Hua P. Sampson Goon, MD, <Dictator> Tex Conroy Sampson Goon MD ELECTRONICALLY SIGNED 08/17/2012 20:56

## 2014-04-28 NOTE — Discharge Summary (Signed)
PATIENT NAME:  Gina Potts, Gina Potts MR#:  [id number] DATE OF BIRTH:  02/23/1921  DATE OF ADMISSION:  08/09/2012 DATE OF DISCHARGE:  08/20/2012  Please refer to interim discharge summary dictated was 08/18/2012 by Dr. Elisabeth PigeonVachhani.   DISCHARGE DIAGNOSIS: 1.  Sepsis secondary to pneumonia.  2.  Healthcare-associated pneumonia.  3.  Bacteremia with methicillin-sensitive Staphylococcus aureus. 4.  Chronic obstructive pulmonary disease.  5.  Hypertension.  6.  Diabetes.  7.  Failure to thrive.  8.  Hip pain.  CONSULTANTS: Nephrology, Dr. Mosetta PigeonHarmeet Singh; ID, Dr. Sampson GoonFitzgerald; pulmonary, Dr. Meredeth IdeFleming; palliative care, Dr. Harvie JuniorPhifer.   IMPORTANT LABORATORY AND DIAGNOSTICS: Same as interim discharge summary on 08/18/2012.   HISTORY AND HOSPITAL COURSE: Overall, the patient is an 81 year old female who was admitted on 08/04 from [facility] with COPD exacerbation/pneumonia, chronic oxygen use of 2 liters nasal cannula, was found to have consolidation of the right upper lobe and right lower lobe. She was put on antibiotics, started on Levaquin, and had blood cultures that were positive for MSSA, second blood culture is negative now, and the patient was on IV therapy with vancomycin. Dr. Sampson GoonFitzgerald evaluated the patient and recommended IV antibiotics for 2 weeks. The family did not want this and they actually felt that she was more appropriate for comfort care, palliative care services.   Dr. Sampson GoonFitzgerald changed her antibiotics to moxifloxacin and doxycycline orally, but after a conversation with the family they decided not to continue any antibiotic treatments for what the patient is going to go only with therapy focused on comfort.   As far as her problems go: Altered mental status was likely secondary to the sepsis, metabolic encephalopathy. The patient is still very lethargic with progressive worsening of her condition. As far as her COPD, the patient was on oxygen supplementation and now she will only continue  nebulizers as needed. She is not going to continue any treatment for diabetes or hypertension and as far as her failure to thrive the patient is going to receive only comfort care and she could eat whatever she wants for comfort issues, pleasure feedings.   The patient is discharged under hospice services to a hospice home today. Please refer to interim discharge.  TIME SPENT: About 40 minutes with this discharge. ____________________________ Felipa Furnaceoberto Sanchez Gutierrez, MD rsg:sb D: 08/20/2012 10:22:21 ET T: 08/20/2012 10:36:53 ET JOB#: [id number]  cc: Felipa Furnaceoberto Sanchez Gutierrez, MD, <Dictator> Duane LopeJeffrey D. Judithann SheenSparks, MD Mosetta PigeonHarmeet Singh, MD Stann Mainlandavid P. Sampson GoonFitzgerald, MD Herbon E. Meredeth IdeFleming, MD Ned GraceNancy Phifer, MD Pearletha FurlOBERTO SANCHEZ GUTIERRE MD ELECTRONICALLY SIGNED 08/21/2012 15:29

## 2014-04-28 NOTE — H&P (Signed)
PATIENT NAME:  Gina Potts, GASSEN MR#:  [id number] DATE OF BIRTH:  11-10-1921  DATE OF ADMISSION:  08/09/2012  REFERRING PHYSICIAN: Dr. Charlesetta Ivory.   PRIMARY CARE PHYSICIAN: Dr. Juluis Pitch.   CHIEF COMPLAINT: Shortness of breath, cough.   HISTORY OF PRESENT ILLNESS: This is an 81 year old female, who is a nursing home resident at [facility], with a past medical history of COPD, chronic respiratory failure on 3 liters nasal cannula, history of lung cancer status post resection. The patient was recently discharged earlier this month from [facility] for diagnosis of COPD exacerbation and acute bronchitis. The patient was at the nursing home, where she had complaints of shortness of breath over the last few days. As well, she reports cough with sputum, but as well she reports she feels she is congested but she cannot produce the sputum. As well, she complained of feeling feverish and febrile, but she was afebrile in the ED. As well, she had some sweating and cough and generally weak with lightheadedness. The patient was found to have significant leukocytosis at 16.9. Her chest x-ray did show a new right upper lung infiltrate. The patient had blood cultures sent in the ED and was started on broad-spectrum antibiotics for healthcare-acquired pneumonia. Hospitalist service was requested to admit the patient for further treatment of her healthcare-acquired pneumonia. The patient denies any chest pain, any diarrhea, any constipation, any dysuria or polyuria.   REVIEW OF SYSTEMS:  CONSTITUTIONAL: The patient complains of feeling feverish, fatigue, generalized weakness. Denies weight gain, weight loss. Reports poor appetite, decreased p.o. intake.  EYES: Denies blurry vision, double vision, inflammation, glaucoma.  ENT: Denies tinnitus, ear pain, epistaxis or discharge.  RESPIRATORY: Complains of cough and shortness of breath. Has a history of COPD. Denies any hemoptysis.  CARDIOVASCULAR:  Denies chest pain, edema, arrhythmia, palpitation, syncope.  GASTROINTESTINAL: Denies nausea, vomiting, diarrhea, abdominal pain, hematemesis, melena, rectal bleed, diarrhea, constipation.  GENITOURINARY: Denies dysuria, hematuria, renal colic.  ENDOCRINE: Denies polyuria, polydipsia, heat or cold intolerance.  HEMATOLOGY: Denies anemia, easy bruising, bleeding diathesis.  INTEGUMENTARY: Denies acne, rash or skin lesions.  MUSCULOSKELETAL: Denies any gout, cramps. Reports history of arthritis.  NEUROLOGIC: Denies any CVA, TIA, headache, ataxia, tremors.  PSYCHIATRIC: Denies anxiety, insomnia, bipolar disorder or schizophrenia.   PAST MEDICAL HISTORY:  1. COPD.   2. Chronic respiratory failure, on 3 liters nasal cannula.  3. History of lung cancer, status post resection.  4. History of pulmonary embolism.  5. Paroxysmal atrial fibrillation.  6. Diabetes mellitus, type 2, controlled with diet.  7. Osteoarthritis.  8. Osteoporosis.  9. Hyperlipidemia.  10. Lumbar 5 compression fracture.   PAST SURGICAL HISTORY: Right upper lung lobectomy.   SOCIAL HABITS: Ex-chronic smoker, quit smoking in 1986. No history of alcohol or drug use.   SOCIAL HISTORY: The patient is widowed. Lives at [facility].   FAMILY HISTORY: Significant for hypertension and coronary artery disease.   HOME MEDICATIONS:  1. Prednisone 5 mg oral every Sunday, Tuesday, Thursday, Saturday and 7.5 mg every Monday, Wednesday and Friday.  2. Tylenol Arthritis as needed.  3. Cozaar 100 mg oral daily.  4. Nitro-Dur 0.4 transdermal film 1 patch at 9 a.m., remove at 9 p.m.  5. Ativan 0.5 mg every 4 hours as needed for anxiety.  6. Metoprolol tartrate 75 mg 2 times a day.  7. Advair Diskus 500/50 mcg inhalational 1 puff b.i.d.  8. DuoNebs 4 times a day.  9. DuoNebs as needed every 2 hours.  10. Spiriva 18 mcg inhalational daily.  11. Voltaren topical gel 1% applied to affected area.  12. Singulair 10 mg oral daily.   13. Potassium 10 mEq oral daily.  14. Calcium 600 with vitamin D 1 tablet oral daily.  15. Vitamin B12 1000 mg oral daily.  16. Oxygen 3 liters nasal cannula continuous.   PHYSICAL EXAMINATION:  VITAL SIGNS: Temperature 98.1, pulse 80, respiratory rate 18, blood pressure 122/37, saturating 98% on 3 liters nasal cannula.  GENERAL: Frail, chronically ill-appearing female, looks comfortable in bed, in no apparent distress.  HEENT: Head is atraumatic, normocephalic. Pupils equal, reactive to light. Pink conjunctivae. Anicteric sclerae. Dry oral mucosa. No oral thrush.  NECK: Supple. No thyromegaly. No JVD.  CHEST: Lake air entry bilaterally. No wheezing. Has right lung rhonchi.  CARDIOVASCULAR: S1, S2 heard. No rubs, murmurs, gallops.  ABDOMEN: Soft, nontender, nondistended. Bowel sounds present.  EXTREMITIES: No edema. No clubbing. No cyanosis. Has chronic lower extremity hyperpigmentation. Dorsalis pedis pulse felt bilaterally.  SKIN: Normal skin turgor. No rash. Warm and dry.  PSYCHIATRIC: The patient is awake, alert, oriented x 3, appropriate.  NEUROLOGIC: Cranial nerves grossly intact. Motor 5 out of 5. No focal deficits.   PERTINENT LABS: BNP 2017, glucose 131, BUN 25, creatinine 0.96, sodium 137, potassium 4.4. Total protein 6.8, albumin 1.9, total bili 0.5, alk phos 110, AST 11, ALT 10. Troponin less than 0.02. CK-MB less than 0.5. White blood cell 16.9, hemoglobin 11, hematocrit 32.5, platelets 263.   EKG showing normal sinus rhythm without significant ST or T wave changes, at rate of 82 beats per minute.   ASSESSMENT AND PLAN:  1. Healthcare-associated pneumonia: The patient presents with shortness of breath, cough, productive sputum and right lung infiltrate. The patient was recently discharged from the hospital, so she will be started, therefore, on treatment for healthcare-associated pneumonia. As well, given her structural lung disease due to her chronic obstructive pulmonary  disease and history of lung cancer status post surgery, she will be started on broad-spectrum antibiotics. She will be on Levaquin, meropenem and intravenous vancomycin. Will send sputum cultures and blood cultures and will downgrade her antibiotics when cultures are back. Meanwhile, will keep her on her oxygen at 3 liters nasal cannula.  2. Hypertension: Blood pressure is on the lower side, so will hold all of her meds. Will resume her meds when she is more stable and appropriately hydrated.  3. Protein calorie malnutrition: The patient's albumin level is 1.9. Appears to be clinically malnourished, so will start her on Ensure 3 times a day.  4. History of chronic obstructive pulmonary disease: Does not appear to be in exacerbation. Has no wheezing. Will continue her on her home medications, including prednisone, Spiriva, Advair, Singulair and on her nebulizer treatment.  5. Diabetes mellitus: Controlled with diet. Will continue to monitor.  6. Hyperlipidemia: Continue with statin.  7. Chronic respiratory failure: Continue with her oxygen at 3 liters nasal cannula.  8. Deep vein thrombosis prophylaxis: Subcutaneous heparin.  9. Gastrointestinal prophylaxis: On proton pump inhibitor.   CODE STATUS: The patient reports she is DNR. As well, has DNR sheet with her medical records from the nursing home.   TOTAL TIME SPENT ON ADMISSION AND PATIENT CARE: 55 minutes.   ____________________________ Albertine Patricia, MD dse:gb D: 08/09/2012 05:11:09 ET T: 08/09/2012 05:25:30 ET JOB#: [id number]  cc: Albertine Patricia, MD, <Dictator> Notnamed Scholz Graciela Husbands MD ELECTRONICALLY SIGNED 08/10/2012 7:10

## 2014-04-28 NOTE — H&P (Signed)
PATIENT NAME:  Gina Potts, Gina Potts MR#:  [id number] DATE OF BIRTH:  1921/06/05  DATE OF ADMISSION:  07/24/2012  PRIMARY CARE PHYSICIAN:  Dr. Dorothey Basemanavid Bronstein.   REFERRING PHYSICIAN:  Dr. Theophilus Kindshristina Braud.   CHIEF COMPLAINT:  Feeling unwell x 2 days, increased lethargy, aching pains and fever.   HISTORY OF PRESENT ILLNESS:  Gina Potts is an 81 year old Caucasian female, nursing home resident, of [facility] with a past history of chronic obstructive pulmonary disease, chronic respiratory failure, home oxygen dependent on 3 liters, history of lung cancer, status post resection.  The patient was in her usual state of health until about the last two days where she was complaining feeling bad, specifically has lethargy, achy generalized pains all over the body associated with a fever reaching 101.4.  The patient also reports mild productive cough for the last two days.  The patient is a poor historian and does not volunteer to give information and it was difficult to take adequate history.  The hospitalist was consulted to admit the patient for possible pneumonia, however on reviewing the chest x-ray it does not show a clear pneumonia.   REVIEW OF SYSTEMS:  CONSTITUTIONAL:  Reports having fever and chills and generalized fatigue.  EYES:  No blurring of vision.  No double vision.  EARS, NOSE, THROAT:  No hearing impairment.  No sore throat.  No dysphagia.  CARDIOVASCULAR:  No chest pain.  No shortness of breath.  No edema or syncope.  RESPIRATORY:  Mild productive cough for the last couple of days with wheezing.  No hemoptysis.  No chest pain.  GASTROINTESTINAL:  No abdominal pain, no vomiting, no diarrhea.  GENITOURINARY:  No dysuria.  No frequency of urination.  MUSCULOSKELETAL:  Reports generalized aching pains in the muscles and joints.  No joint swelling.  No muscular swelling.  INTEGUMENTARY:  No skin rash.  No ulcers.  NEUROLOGY:  No focal weakness.  No seizure activity.  No headache.   PSYCHIATRY:  No anxiety or depression.  ENDOCRINE:  No polyuria or polydipsia.  No heat or cold intolerance.   PAST MEDICAL HISTORY:  Chronic obstructive pulmonary disease, chronic respiratory failure, home oxygen dependent on 3 liters, history of lung cancer, status post resection, history of pulmonary embolism, paroxysmal atrial fibrillation, diabetes mellitus type 2 on diet, osteoarthritis, and osteoporosis, hyperlipidemia, lumbar 5 compression fracture.   PAST SURGICAL HISTORY:  Right upper lung lobectomy.   SOCIAL HABITS:  Ex-chronic smoker.  She quit smoking in 1986.  No history of alcohol or drug abuse.   SOCIAL HISTORY:  She is widowed.  Lives at [facility].   FAMILY HISTORY:  She does not give details, but according to the records, she has a family history of hypertension, coronary artery disease and laryngeal cancer.   ADMISSION MEDICATIONS:  Prednisone 7.5 mg alternating with 5 mg every other day.  Lasix 20 mg a day, potassium chloride 10 mEq once a day.  Vitamin B12 1000 mcg once a day, calcium with vitamin D 600 mg a day, Nitro-Dur patch once a day 0.4 mg.  Spiriva 1 inhalation once a day, Singulair 10 mg once a day.  DuoNeb solution q. 4 hours as needed, Ativan 0.5 mg q. 4 hours as needed for anxiety.  Advair Diskus 1 puff twice a day, Cozaar 100 mg once a day, metoprolol tartrate 75 mg twice a day, Tylenol as needed, Vitamin D3 1000 units once a day.   ALLERGIES:  MULTIPLE ALLERGIES.  THESE INCLUDE ASPIRIN, CODEINE,  DARVOCET, DOXYCYCLINE, KEFLEX, NAPROSYN, NOVOCAINE, PENICILLIN, SULFA, VALIUM, LATEX.   PHYSICAL EXAMINATION: VITAL SIGNS:  Blood pressure is 99/44, respiratory rate 20, pulse 117, temperature 98.6.  Oxygen saturation on oxygen 98%.  GENERAL APPEARANCE:  Elderly female sitting on the stretcher in no acute distress, but she appears a little anxious.  HEAD AND NECK:  No pallor.  No icterus.  No cyanosis.  Ear examination revealed normal hearing, no discharge, no  lesions.  Examination of the nose showed no discharge, no bleeding, no ulcers.  Oropharyngeal examination revealed normal lips.  She has dry mucous membranes, no oral thrush, no ulcers.  Eye examination revealed normal eyelids and conjunctivae.  Pupils about 4 mm, round, equal and sluggishly reactive to light.  NECK:  Supple.  Trachea at midline.  No thyromegaly.  No cervical lymphadenopathy.  No masses.  HEART:  Revealed normal S1, S2.  No S3, S4.  No murmur.  No gallop.  No carotid bruits.  RESPIRATORY:  Revealed normal breathing pattern without use of accessory muscles.  There is a slight prolongation of the expiratory phase, a few scattered wheezes.  No rales.  ABDOMEN:  Soft without tenderness.  No hepatosplenomegaly.  No masses.  No hernias.  SKIN:  Revealed no ulcers.  No subcutaneous nodules.  MUSCULOSKELETAL:  No joint swelling.  No clubbing.  NEUROLOGIC:  Cranial nerves II through XII are intact.  No focal motor deficit.  PSYCHIATRIC:  The patient is alert, oriented to place and people.  Mood and affect appears slightly depressed.   LABORATORY FINDINGS:  Her chest x-ray showed chronic lung changes.  No definite consolidation or effusion.  Her EKG showed sinus tachycardia at a rate of 116 per minute.  Otherwise unremarkable EKG.  Her serum glucose 100, BUN 21, creatinine 1.1, sodium 135, potassium 3.8, calcium 9.4.  Total protein 7.1, albumin 2.8.  Normal liver transaminases.  CBC showed a white count of 14,000, hemoglobin 11, hematocrit 34, platelet count 212.  Urinalysis was unremarkable.   ASSESSMENT: 1.  Mild acute exacerbation of chronic obstructive pulmonary disease.  2.  Acute febrile illness.  3.  Mild dehydration.  4.  Chronic respiratory failure, home oxygen dependent on 3 liters.  5.  Past history of lung cancer, status post resection.  6.  Paroxysmal atrial fibrillation, but now she is in sinus rhythm.   7.  History of pulmonary embolism.  8.  Type 2 diabetes mellitus, on  diet.  9.  Hyperlipidemia. 10.  DO NOT RESUSCITATE status.   PLAN:  We will admit the patient to the medical floor.  Intensify treatment with DuoNebs.  Hold oral prednisone and start IV Solu-Medrol small dose 40 mg twice a day.  Continue oxygen supplementation.  Blood cultures x 2 were taken.  IV Levaquin was started in the Emergency Department and I will continue so.  Hold Lasix since she is slightly dehydrated and we will start gentle IV hydration.  Continue the rest of the home medications as listed above.  Continue oxygen.  For deep vein thrombosis prophylaxis, we will place her on Lovenox subcutaneous once a day.  The patient does have a LIVING WILL and her CODE STATUS is DO NOT RESUSCITATE.  She has a formal paper signed by her primary care physician.   Time spent in evaluating this patient and reviewing medical records took more than 1 hour.     ____________________________ Carney Corners. Rudene Re, MD amd:ea D: 07/24/2012 02:18:40 ET T: 07/24/2012 04:11:00 ET JOB#: [id number]  cc: Karolee Ohs  Dala Dock, MD, <Dictator> Karolee Ohs Dala Dock MD ELECTRONICALLY SIGNED 07/24/2012 6:09

## 2014-04-30 NOTE — Consult Note (Signed)
Brief Consult Note: Diagnosis: Fracture left proximal humerus and pubis.   Patient was seen by consultant.   Recommend further assessment or treatment.   Orders entered.   Comments: 81 year old female tripped over a cord in her kitchen yesterday afternoon and fell on left side.  Brought to Emergency Room where exam and X-rays show a non displaced surgical neck fracture left humerus and non displaced left pubic rami fractures. Admitted for pain control and rehabilitation.  She is already a hospice patient for severe chronic obstructive pulmonary disease and on O2 at home. On coumadin for prior pulmonary embolism.    Exam: Alert and talkative.  circulation/sensation/motor function good left arm and legs.  Tender upper left arm with some ecchymosis.  Pain with range of motion.  Tender left pelvis.  Hips have good range of motion. Skin intact.  Right side non tender.  X-rays: Fracture surgical neck left humerus and left pubic rami.  Rx: These are non operative fractures and she is not a good operative candidate anyway.       Symptomatic care       Watch hemoglobin level closely as these bleed freely and she is on coumadin (INR 1.5)       Left shoulder immobilizer.       out of bed in chair and Physical Therapy as tolerated.  partial weight bearing left leg.        Will need skilled nursing facility  Electronic Signatures: Valinda HoarMiller, Arneisha Kincannon E (MD)  (Signed 27-Apr-13 12:23)  Authored: Brief Consult Note   Last Updated: 27-Apr-13 12:23 by Valinda HoarMiller, Lakeva Hollon E (MD)

## 2014-04-30 NOTE — H&P (Signed)
PATIENT NAME:  Gina Potts, Gina Potts MR#:  [id number] DATE OF BIRTH:  02/26/1921  DATE OF ADMISSION:  05/02/2011  PRIMARY CARE PHYSICIAN: Aram BeechamJeffrey Sparks, MD  REQUESTING PHYSICIAN: Joseph ArtAlice Finnell, MD  CHIEF COMPLAINT: Left shoulder and groin pain status post fall.   HISTORY OF PRESENT ILLNESS: The patient is an 81 year old female with a known history of endstage COPD, on hospice and 3 liters of oxygen all the time, who had a followup appointment with Dr. Judithann SheenSparks this afternoon. She was doing well. She had a podiatry appointment at 4 o'clock  subsequent to that and had her nails trimmed after which she came back home, got hung up in her oxygen tubbing and tripped and fall injuring her left shoulder and groin area. Subsequently, she was having severe pain and EMS was called who brought her to the emergency department. While in the ED, she is found to have a nondisplaced left superior pubic ramus fracture and nondisplaced fracture of the surgical neck of the left proximal humerus. She is being admitted for pain control and further evaluation and management.  Her son is at the bedside providing most of the information as she is in a lot of pain and does not want to talk.  PAST MEDICAL HISTORY:  1. Chronic respiratory failure, on 3 liters of oxygen all the time.  2. Chronic obstructive pulmonary disease.  3. History of lung cancer. 4. Fibrocystic breast. 5. History of pulmonary embolus.  6. Osteoporosis.  7. L5 compression fracture.  8. Hyperlipidemia.  9. Temporal arteritis. 10. Type 2 diabetes. 11. Osteoarthritis.  12. Chronic atrial fibrillation.   ALLERGIES: Aspirin, codeine, Darvocet, doxycycline, Keflex, Naprosyn, novocaine, penicillin, sulfa, Valium, Vioxx, Effexor, and Zetia.  FAMILY HISTORY: Positive for coronary artery disease, hypertension, and laryngeal cancer.   PAST SURGICAL HISTORY:  1. Right upper lobectomy in 1986. 2. Left breast biopsy.  3. Right ganglion cyst removal.   HOME  MEDICATIONS: (Per last discharge summary) 1. Bystolic 5 mg p.o. daily.  2. Lasix 40 mg p.o. daily.  3. Ultram 50 mg p.o. every 6 hours as needed.  4. Fosamax 70 mg p.o. once a week.  5. DuoNebs nebulizer four times a day.  6. Mucinex 600 mg p.o. twice a day. 7. 3 liters of oxygen all the time. 8. Serevent one puff twice a day. 9. Coumadin 2.5 mg p.o. at bedtime.  10. Prednisone taper as directed. 11. Ativan 0.5 mg p.o. every four hours as needed.   REVIEW OF SYSTEMS: Unobtainable as the patient is in a lot of pain and would not communicate.   PHYSICAL EXAMINATION:  VITALS: Temperature 97.9, heart rate 98 per minute, respirations 16 per minute, blood pressure 170/76 mmHg, and she is saturating 94% on 3 liters oxygen via nasal cannula.  GENERAL: The patient is an 81 year old female lying in the bed in excruciating pain.   EYES: Pupils are equally round and reactive to light and accommodation. No scleral icterus. Extraocular muscles are intact.   HENT: Head atraumatic, normocephalic. Oropharynx and nasopharynx clear.   NECK: Supple. No jugular venous distention. No thyroid enlargement or tenderness.   LUNGS: Clear to auscultation bilaterally. No wheezing, rales, rhonchi, or crepitation.   HEART: Irregularly irregular heart rate.  ABDOMEN: Soft, nontender, and nondistended. Bowel sounds present. No organomegaly or mass.   EXTREMITIES: She has tenderness all over both lower extremities. They are very cold, mainly both feet, and clammy. Her left shoulder area has a very tender spot mainly in the proximal area. She  also has significant tenderness in her pelvic area likely due to fracture.   SKIN: She has a lot of old ecchymotic lesions in both lower extremities and a lot of depigmentation in the lower extremities.   NEUROLOGIC: Difficult to evaluate. She has fractures so would not cooperate with the exam as she is in a lot of pain. Sensation seemed to be intact. Oriented to nonfocal  examination. Gait not checked. She is in a lot of pain. Cranial nerves II through XII seem intact.   PSYCHIATRIC: Unable to evaluate as the patient would not cooperate, but she seems quite anxious right now.   LABS/STUDIES: BMP: Normal except for blood sugar of 220. Normal liver function tests. Normal CBC, except hemoglobin of 11.4.  Chest x-ray while in the emergency department showed atelectasis at the left lower lobe.   Left wrist x-ray showed no acute osseous injury.   Left shoulder x-ray showed nondisplaced fracture of the surgical neck of the left proximal humerus.   Left hip x-ray showed no acute osseous injury.   Pelvic x-ray showed nondisplaced left superior pubic rami fracture.   EKG shows sinus tachycardia. No major ST-T changes.   IMPRESSION AND PLAN:  1. Sinus tachycardia likely due to inadequate pain control. We will start her on morphine intravenously to keep her comfortable. She is already under hospice. I had a long discussion with her son who is agreeable to keeping her comfortable only and he understands that she may die. 2. Nondisplaced left superior pubic rami fracture. We will provide conservative management, mainly with pain control. I discussed with Dr. Deeann Saint.  3. Proximal humerus fracture. This will be treated with conservative management. We will provide pain control with morphine and Norco as needed. Consult orthopedics.  4. Chronic obstructive pulmonary disease. She is using 3 liters of oxygen all the time. We will continue same for now.  CODE STATUS: DO NOT RESUSCITATE. Nurse may pronounce.   Her management plan was discussed with the son and explained she looks very sick and potentially may die. We will try to keep her comfortable with adequate pain control. This was also discussed with Dr. Deeann Saint and all are in agreement with the management plan. She may need hospice home depending on her clinical situation during her stay here.  TOTAL TIME  TAKING CARE OF PATIENT: 45 minutes. ____________________________ Ellamae Sia. Sherryll Burger, MD vss:slb D: 05/02/2011 21:32:44 ET T: 05/03/2011 08:02:54 ET JOB#: [id number]  cc: Ravan Schlemmer S. Sherryll Burger, MD, <Dictator> Duane Lope. Judithann Sheen, MD Patricia Pesa MD ELECTRONICALLY SIGNED 05/03/2011 20:23

## 2014-04-30 NOTE — H&P (Signed)
PATIENT NAME:  Gina Potts, Gina Potts MR#:  [id number] DATE OF BIRTH:  01-05-1922  DATE OF ADMISSION:  02/14/2011  PRIMARY CARE PHYSICIAN:  Dr. Aram BeechamJeffrey Sparks   CHIEF COMPLAINT: Shortness of breath.   HISTORY OF PRESENT ILLNESS:  Gina Potts is a pleasant 81 year old Caucasian female with chronic respiratory failure and history of smoking on chronic home oxygen who comes to the Emergency Room with increasing shortness of breath and respiratory distress which began two days ago, however progressed and got worse this morning. The patient realized she was not getting much oxygen via her oxygen equipment and her son came to check on it and there was some malfunctioning of the oxygen equipment. She was very hypoxic, got significant chest tightness, and came to the Emergency Room on BiPAP. The patient received breathing treatments, Solu-Medrol. She is currently off BiPAP on 3 liters nasal cannula oxygen and she feels and looks much better. Her chest x-ray does not show any evidence of pneumonia.  It shows some pulmonary fibrosis and findings consistent with chronic obstructive pulmonary disease. The patient is being admitted for further evaluation and management.   PAST MEDICAL HISTORY:  1. Chronic obstructive pulmonary disease with remote history of tobacco abuse, quit in 1984, on chronic home oxygen 3 liters minutes.  2. History of lung cancer.  3. Fibrocystic disease of the breast.  4. History of left lower extremity deep vein thrombosis in the 1990s.  5. History of pulmonary embolus in December 2011 on Coumadin.  6. History of pneumonia in the past.  7. Osteoporosis with L5 compression fracture.  8. Hyperlipidemia.  9. History of temporal arteritis.   10. The patient is on chronic steroids for chronic obstructive pulmonary disease.  11. History of colon polyps.  12. Osteoarthritis.  13. Diabetes. She is not on any medications.    PAST SURGICAL HISTORY:  1. Right upper lobectomy 1986.  2. Left breast  biopsy.  3. Status post right ganglion cyst removal.   ALLERGIES: Penicillin, oral mycins, codeine sulfate, Valium, sulfa, Dyazide, Vibramycin, Desyrel, Darvocet, Pravachol, Skelaxin, aspirin, novocaine, naproxen, Zetia, Keflex, Vioxx, and Effexor.   FAMILY HISTORY: Positive for coronary artery disease, hypertension, laryngeal cancer.   HOME MEDICATIONS:  1. Serevent one puff b.i.d.  2. Fosamax 70 mg q. week.  3. Oxygen 3 liters per minute per nasal cannula.  4. Ativan 0.5 mg p.o. at bedtime p.r.n.  5. Duo-Neb SVN q.i.d.  6. Bystolic 5 mg p.o. daily.  7. Ultram 50 mg q. 6 p.r.n.  8. Protonix 40 mg daily.  9. Lasix 40 mg daily.  10. Coumadin 5 mg every other day alternating with 2.5 mg every other day.  11. Prednisone 10 mg p.o. daily. She takes this on a chronic basis for her chronic obstructive pulmonary disease.  12. Mucinex 600 mg b.i.d.   REVIEW OF SYSTEMS: CONSTITUTIONAL: Positive for fatigue and weakness. No fever.  EYES: No blurred or double vision. ENT: No tinnitus, ear pain, or hearing loss. RESPIRATORY: Positive for cough, wheeze, and chronic obstructive pulmonary disease flare.  CARDIOVASCULAR: Positive for chest tightness. Positive for hypertension. GI: No nausea, vomiting, diarrhea, or abdominal pain. GU: No dysuria or hematuria. ENDOCRINE: No polyuria or nocturia. HEME: No anemia or easy bruising. SKIN: No acne or rash. MUSCULOSKELETAL: Positive for arthritis. NEUROLOGIC: No cerebrovascular accident or transient ischemic attack. PSYCH:  Positive for some anxiety. No depression. All other systems reviewed and negative.   PHYSICAL EXAMINATION:  GENERAL: The patient is awake, alert, and oriented times  three, not in acute distress.   VITAL SIGNS: Afebrile, pulse is 92 to 93, blood pressure is 126/70, sats 96% on 3 liters.   HEENT: Atraumatic, normocephalic. Pupils are equal, round, and reactive to light and accommodation. Extraocular movements intact. Oral mucosa is moist.    NECK: Supple. No JVD. No carotid bruit.   RESPIRATORY: Bilateral wheezing present, more expiratory. No use of accessory muscles. No crackles heard.   CARDIOVASCULAR: Both the heart sounds are normal. Rate is tachycardic. Rhythm is normal. No murmur heard. PMI is not lateralized. Chest nontender.   EXTREMITIES: Good pedal pulses, good femoral pulses.  There is 1+ extremity edema, more at the ankles.   ABDOMEN: Soft, benign, and nontender. No organomegaly. Positive bowel sounds.   NEUROLOGIC: Grossly intact cranial nerves II through XII. No motor or sensory deficit.   PSYCH: The patient is awake, alert, and oriented times three.  LABORATORY, DIAGNOSTIC, AND RADIOLOGICAL DATA: pH is 7.37. Chest x-ray:  Possible right hilar mass.  Glucose 167. Rest of the chemistry is normal except for CO2 of 37. CBC within normal limits. PT-INR 21.5-1.8. Troponin is 0.06.   ASSESSMENT: 81 year old Gina Potts with:  1. Acute on chronic hypoxic hypercarbic respiratory failure secondary to chronic obstructive pulmonary disease flare when oxygen equipment malfunctioned.  2. Hypertension.  3. Right hilar mass. This appears to be new in comparison to the x-ray from June 2012. The patient has history of lung cancer with lobectomy in the past.  4. History of pulmonary fibrosis.  5. Elevated troponin, appears more of demand ischemia from chronic obstructive pulmonary disease flare. The patient denies any chest pain.   PLAN:  1. Admit patient to medical floor.  2. DO NOT RESUSCITATE.  3. Continue IV fluids.  4. I will give Solu-Medrol around the clock. I will hold off on antibiotics, do not see indication for giving antibiotics at present. The patient is afebrile.  Chest x-ray with no pneumonia and white count is normal. The patient does not even have a cough.  5. Continue breathing treatment, inhalers around the clock.  6. We will get CT of the chest to evaluate the right hilar mass.  7. Continue all home  medications including Coumadin.  8. Follow PT-INR.  9. Care management for discharge planning.  10. Above admission plan was discussed with the patient and the patient's son who are agreeable to it.   TIME SPENT: 50 minutes.  ____________________________ Wylie Hail Allena Katz, MD sap:bjt D: 02/14/2011 12:05:32 ET T: 02/14/2011 12:24:37 ET JOB#: [id number]  cc: Teleshia Lemere A. Allena Katz, MD, <Dictator> Duane Lope. Judithann Sheen, MD Willow Ora MD ELECTRONICALLY SIGNED 02/18/2011 13:27

## 2014-04-30 NOTE — Discharge Summary (Signed)
PATIENT NAME:  Gina Potts, Gina Potts MR#:  [id number] DATE OF BIRTH:  10-25-1921  DATE OF ADMISSION:  05/02/2011 DATE OF DISCHARGE:  05/07/2011   TYPE OF DISCHARGE: The patient is transferred to skilled nursing facility.   REASON FOR ADMISSION: Left shoulder and groin pain status post fall.   HISTORY OF PRESENT ILLNESS: The patient is an 81 year old female with history of end-stage COPD with chronic respiratory failure on oxygen followed by Hospice. The patient also has a history of atrial fibrillation and lung cancer. She presented to the Emergency Room after falling. In the Emergency Room, the patient was found to have a pelvic fracture as well as a left proximal humerus fracture. These were nonoperative. She was unable to live at home due to her pain and weakness. She was admitted for further evaluation.   PAST MEDICAL HISTORY:  1. Chronic respiratory failure, on oxygen.  2. Chronic obstructive pulmonary disease.  3. History of lung cancer.  4. Fibrocystic breast disease.  5. History of pulmonary embolism.  6. Chronic atrial fibrillation.  7. Osteoporosis.  8. L5 compression fracture.  9. Temporal arteritis.  10. Hyperlipidemia.  11. Type II diabetes. 12. Osteoarthritis.   MEDICATIONS ON ADMISSION: Please see admission note.   ALLERGIES: Codeine, aspirin, Darvocet, doxycycline, Keflex, Naprosyn, Novocain, penicillin, sulfa, Valium, Vioxx, Effexor, and Zetia.   SOCIAL HISTORY: Negative for alcohol or tobacco abuse.   FAMILY HISTORY: Positive for coronary artery disease, hypertension, and laryngeal cancer.   REVIEW OF SYSTEMS: As per history of present illness.   PHYSICAL EXAMINATION: GENERAL: The patient is chronically ill appearing in no acute distress. Vital signs are currently remarkable for a blood pressure of 170/76 with a heart rate of 98 and a respiratory rate of 16. She is afebrile. HEENT: Normocephalic, atraumatic. Pupils equally round and reactive to light and accommodation.  Extraocular movements are intact. Sclerae are nonicteric. Conjunctivae are clear. Oropharynx is clear. NECK is supple without JVD. LUNG exam revealed decreased breath sounds with scattered rhonchi. CARDIAC exam revealed irregularly irregular rhythm. ABDOMEN soft, nontender with normoactive bowel sounds. No organomegaly or mass were appreciated. EXTREMITIES revealed stasis changes with trace edema. NEUROLOGIC exam was grossly nonfocal.   HOSPITAL COURSE: The patient was admitted with intractable pain with pelvic and humeral fractures. Due to the pain, the patient was tachycardic. She was admitted as a NO CODE BLUE, DO NOT RESUSCITATE. Hospice was consulted. Orthopedics was consulted as well. Orthopedic consultant recommended pain management and physical therapy. The patient was seen in consultation by physical therapy who recommended rehab placement. Her pain was controlled with oral medications. She did subsequently develop anemia and required 2 units of packed red blood cells in transfusion. This was felt to be stress related as there was no evidence of active bleeding. Her respiratory status remained stable on oxygen on her usual medications. She is now transferred to the nursing home for further care and rehabilitation as a [place], DO NOT RESUSCITATE.   DISCHARGE DIAGNOSES:  1. Left proximal humerus fracture.  2. Pelvic fracture.  3. End-stage chronic obstructive pulmonary disease, on oxygen.  4. Chronic respiratory failure.  5. History of lung cancer, status post right upper lobectomy.  6. History of pulmonary embolism.  7. Osteoporosis.  8. Chronic atrial fibrillation.  9. Hyperlipidemia.  10. Type II diabetes. 11. Temporal arteritis.  12. L5 compression fracture.  13. Osteoarthritis.   DISCHARGE MEDICATIONS:  1. Norco 5/325 1 to 2 p.o. q.4 hours p.r.n. pain.  2. Zofran 4  mg p.o. q.4 hours p.r.n. nausea, vomiting.  3. Prednisone taper as directed.  4. Metoprolol tartrate 25 mg p.o.  b.i.d.  5. Lorazepam 0.5 mg p.o. q.4 hours p.r.n. anxiety.  6. Serevent Diskus 2 puffs b.i.d.  7. Flovent 220 mcg 1 puff b.i.d.  8. Spiriva 1 capsule inhaled daily. 9. Nitro-Dur 0.4 mg topically daily, remove at bedtime.  10. Levaquin 250 mg p.o. daily x7 days.  11. Oxygen at 3 liters per minute per nasal cannula.  12. DuoNeb SVNs q.i.d. and q.2 hours p.r.n. shortness of breath.   FOLLOW-UP PLANS AND APPOINTMENTS:  1. The patient was discharged to the skilled nursing facility and will be followed by the resident physician there.  2. She is on a 2 gram sodium diet.  3. She is a NO CODE BLUE, DO NOT RESUSCITATE.  4. She will be seen in consultation by physical therapy.  5. Will obtain a CBC and a MET-B in one week.   ____________________________ Leonie Douglas. Doy Hutching, MD jds:drc D: 05/07/2011 07:20:15 ET T: 05/07/2011 07:45:55 ET JOB#: [id number]  cc: Leonie Douglas. Doy Hutching, MD, <Dictator> Enolia Koepke Lennice Sites MD ELECTRONICALLY SIGNED 05/07/2011 9:18

## 2014-04-30 NOTE — Discharge Summary (Signed)
PATIENT NAME:  Gina Potts, Gina Potts MR#:  [id number] DATE OF BIRTH:  July 18, 1921  DATE OF ADMISSION:  03/30/2011 DATE OF DISCHARGE:  04/03/2011  REASON FOR ADMISSION: Shortness of breath.   HISTORY OF PRESENT ILLNESS: Please see the dictated history of present illness done by Dr. Katharina Caperima [place]ute on 03/30/2011.   PAST MEDICAL HISTORY:  1. Chronic respiratory failure, on oxygen.  2. Chronic obstructive pulmonary disease.  3. History of lung cancer.  4. Remote history of tobacco abuse.  5. Fibrocystic breast disease.  6. History of pulmonary embolism, on anticoagulation.  7. History of pneumonia.  8. Osteoporosis.  9. Hyperlipidemia.  10. Temporal arteritis. 11. Type II diabetes.  12. Osteoarthritis.  13. Chronic atrial fibrillation.  14. L5 compression fracture.   MEDICATIONS ON ADMISSION: Please see admission note.   ALLERGIES: Penicillin, mycins, codeine, Valium, sulfa, Dyazide, Vibramycin, Desyrel, Darvocet, Pravachol, Skelaxin, aspirin, Novocain, Naproxen, Zetia, Keflex, Vioxx, and Effexor.   SOCIAL HISTORY, FAMILY HISTORY, AND REVIEW OF SYSTEMS: As per history of present illness.   PHYSICAL EXAMINATION: The patient was in mild respiratory distress. Vital signs were remarkable for a blood pressure of 155/76 with a heart rate of 113. She was afebrile. HEENT exam was unremarkable. NECK was supple without JVD. LUNGS revealed decreased breath sounds throughout. CARDIAC exam revealed a rapid rate with a regular rhythm. Normal S1 and S2. ABDOMEN soft and nontender. EXTREMITIES were without edema. NEUROLOGIC exam was grossly nonfocal.   HOSPITAL COURSE: The patient was admitted with acute on chronic respiratory failure with underlying chronic obstructive pulmonary disease and lung cancer. She was started on IV steroids with IV antibiotics as well as Xopenex and Atrovent SVNs. She did have some right lower extremity swelling noted at the time of admission. Right lower extremity ultrasound was  negative for DVT. The patient's symptoms improved and she was quickly weaned off BiPAP to nasal cannula oxygen back to her baseline 3 liters. She continued to improve in the hospital. Follow-up chest x-ray revealed chronic changes with no acute CHF noted. The patient's symptoms resumed to baseline. She was switched from IV antibiotics and IV steroids to oral antibiotics and oral steroids. By 04/03/2011, the patient was stable and ready for discharge.   DISCHARGE DIAGNOSES:  1. Presumed pneumonia.  2. Chronic obstructive pulmonary disease exacerbation.  3. Acute on chronic respiratory failure.  4. Lung cancer. 5. Anxiety.   DISCHARGE MEDICATIONS:  1. Bystolic 5 mg p.o. daily.  2. Lasix 40 mg p.o. daily.  3. Ultram 50 mg p.o. q.6 hours p.r.n. pain.  4. Fosamax 70 mg p.o. q. week.  5. DuoNeb SVNs q.i.d.  6. Mucinex 600 mg p.o. b.i.d.  7. Oxygen at 3 liters per minute per nasal cannula.  8. Serevent 1 puff b.i.d.  9. Coumadin 2.5 mg p.o. q.p.m.  10. Prednisone taper as directed.  11. Levaquin 500 mg p.o. daily x10 days.  12. Zithromax 250 mg p.o. daily for five days.  13. Ativan 0.5 mg p.o. q.4 hours p.r.n. anxiety.   FOLLOW-UP PLANS AND APPOINTMENTS:  1. The patient will be discharged home with oxygen. She will be followed by Hospice.  2. She is on a low sodium diet.  3. She will follow-up with me in 1 to 2 weeks, sooner if needed.   ____________________________ Duane LopeJeffrey D. Judithann SheenSparks, MD jds:drc D: 04/10/2011 14:31:48 ET T: 04/11/2011 09:22:05 ET JOB#: [id number]  cc: Duane LopeJeffrey D. Judithann SheenSparks, MD, <Dictator> JEFFREY Rodena Medin SPARKS MD ELECTRONICALLY SIGNED 04/11/2011 13:37

## 2014-04-30 NOTE — H&P (Signed)
PATIENT NAME:  Gina, Potts MR#:  [id number] DATE OF BIRTH:  [birth year]  DATE OF ADMISSION:  07/06/2011  PRIMARY CARE PHYSICIAN: Aram Beecham, MD  CHIEF COMPLAINT: Shortness of breath.   HISTORY OF PRESENT ILLNESS: Gina Potts is an 81 year old Caucasian female with history of chronic obstructive pulmonary disease, chronic respiratory failure, home oxygen dependent on 3 liters, and past history of lung cancer status post resection. The patient was in her usual state of health until today. While she was walking she felt fatigued and got short of breath. Her shortness of breath worsened and became severe. The patient was brought to the hospital by ambulance. At the time of first encounter, her oxygen saturation was down to 56%. At that time she was on room air. She was tachycardic with a heart rate of 130. The patient received DuoNebs while en route to the hospital. Here the patient was placed on BiPAP treatment and received further DuoNeb treatment after which she started to improve. Right now she is more calm and breathing much better than earlier. The patient is now being admitted for further evaluation and treatment.   REVIEW OF SYSTEMS: CONSTITUTIONAL: Denies having any fever. No chills. No fatigue, only during the event of her shortness of breath. EYES: No blurring of vision. No double vision. ENT: No hearing impairment. No sore throat. No dysphagia. CARDIOVASCULAR: No chest pain, but admits having shortness of breath and wheezing. No edema. No syncope. RESPIRATORY: No cough. No hemoptysis. No chest pain but had shortness of breath. GASTROINTESTINAL: No abdominal pain. No vomiting. No diarrhea. GENITOURINARY: No dysuria. No frequency of urination. MUSCULOSKELETAL: No joint pain or swelling, other than her chronic joint pains, but nothing acute. No muscular pain or swelling. INTEGUMENTARY: No skin rash. No ulcers. NEUROLOGY: No focal weakness. No seizure activity. No headache. PSYCHIATRY: No anxiety  and no depression, but she has history of anxiety in the past. ENDOCRINE: No polyuria or polydipsia. No heat or cold intolerance.   PAST MEDICAL HISTORY:  1. Chronic respiratory failure on home oxygen of 3 liters.  2. Chronic obstructive pulmonary disease.  3. History of lung cancer status post resection.  4. History of paroxysmal atrial fibrillation.  5. History of pulmonary embolism.  6. Fibrocystic disease of the breast. 7. Osteoporosis. 8. L5 compression fracture.  9. Hyperlipidemia.  10. Diabetes mellitus, type 2, on diet. 11. Osteoarthritis.  12. Past history of temporal arteritis.   PAST SURGICAL HISTORY:  1. Right upper lobe lung resection in 1986 for lung cancer.  2. Left breast biopsy.  3. Right ganglion cyst removal.   SOCIAL HABITS: Ex-chronic smoker, she quit smoking in 1986. No history of alcoholism or other drug abuse.   SOCIAL HISTORY: She is widowed and lives in a nursing home.   FAMILY HISTORY: She could not give me much details, but according to the medical records there is positive family history of coronary artery disease, hypertension, and laryngeal cancer.   ADMISSION MEDICATIONS:  1. DuoNebs every 2 hours as needed, she also takes it regularly of four times a day.  2. Ativan 0.5 mg every 4 hours p.r.n. for anxiety. 3. Flovent 220 mcg one puff twice a day. 4. Lasix 40 mg p.o. daily. 5. Metoprolol 25 mg twice a day. 6. Nitro-Dur 0.4 mg transdermal patch once a day.  7. Norco 5/325 mg 1 to 2 tablets every 4 hours p.r.n. for pain.  8. Oxygen 3 liters.  9. Potassium chloride 10 mEq twice a day. 10. Prednisone  5 mg a day. 11. Serevent 50 mcg once a day.  12. Zofran 4 mg every four hours p.r.n. for nausea or vomiting.   ALLERGIES: Reportedly to multiple medications including aspirin, codeine, Darvocet, doxycycline, Keflex, Naprosyn, novocaine, penicillin, sulfa, and Valium. For these medications the type of reaction is not known. She is also allergic to latex  causing itching and hives.   PHYSICAL EXAMINATION:   VITAL SIGNS: Blood pressure 182/93, respiratory rate 38, pulse 140, and oxygen saturation 100% on oxygen and BiPAP.   GENERAL APPEARANCE: Elderly lady lying in bed. She feels much more comfortable now while she is on BiPAP and oxygen.   HEAD/NECK: No pallor. No icterus. No cyanosis.   EARS, NOSE, AND THROAT: Hearing was normal. Nasal mucosa, lips, and tongue were normal.   EYES: Normal eyelids and conjunctiva. Left conjunctiva is slightly red. Pupils are about 4 to 5 mm, equal and sluggishly reactive to light.   NECK: Supple. Trachea at midline. No thyromegaly. No cervical lymphadenopathy.   HEART: Normal S1 and S2. No S3 or S4. No murmur. No gallop. No carotid bruits.   RESPIRATORY: Slight tachypnea. This is much better than earlier. No wheezing. No rales. The patient was using accessory muscles earlier. Right now she is calming down.   ABDOMEN: Soft without tenderness. No hepatosplenomegaly. No masses. No hernias.   SKIN: No ulcers. No subcutaneous nodules.   MUSCULOSKELETAL: No joint swelling. No clubbing.   NEUROLOGIC: Cranial nerves II through XII are intact. No focal motor deficit.   PSYCHIATRY: The patient is alert and oriented x2, that is to place and people. Mood and affect were normal.   LABORATORY, DIAGNOSTIC AND RADIOLOGIC DATA: Chest x-ray showed increased pulmonary markings. No effusion. No consolidation.   EKG showed sinus tachycardia at rate of 130 per minute, otherwise unremarkable EKG.  Her blood work-up revealed elevated B-type natriuretic peptide at 919, glucose 152, BUN 18, creatinine 1.1, sodium 141, and potassium 4.3. Normal liver function tests. Total CPK 48. Troponin 0.04. CBC showed white count of 14,000, hemoglobin 12, hematocrit 38, and platelet count 309. Prothrombin time 13. INR 0.9. D-dimer was slightly elevated at 2.3   ABG showed a pH of 7.35, pCO2 59, pO2 432, and this is on FiO2 of 100% while  she is on BiPAP.   ASSESSMENT: 1. Acute on chronic respiratory failure likely secondary to chronic obstructive pulmonary disease exacerbation.  2. Chronic respiratory failure, home oxygen dependent on 3 liters.  3. History of lung cancer, status post resection.  4. History of pulmonary embolism.  5. History of paroxysmal atrial fibrillation.  6. Osteoporosis.  7. Hyperlipidemia.  8. Osteoarthritis. 9. Type 2 diabetes mellitus, on diet.   PLAN: Admit the patient to the critical care unit.  Continue BiPAP treatment. I will give the patient one dose of Lasix 40 mg intravenously, although her chest x-ray does not show pulmonary edema. However with her chronic obstructive pulmonary disease and increased pulmonary markings, I cannot be certain if there is some degree of volume overload. We will treat her for chronic obstructive pulmonary disease exacerbation with DuoNebs and small dose of IV Solu-Medrol. If the patient continues to improve, then we will continue along this line. However, if she decompensates or she does not improve, we will consider work-up for pulmonary embolism. Right now, over the last couple of hours, the patient showed marked improvement on the care and therapy. Nevertheless, while she is here, I will put her on deep vein thrombosis prophylaxis with Lovenox.  Continue the rest of her home medications as listed above. Regarding Living Will, the patient does have a Living will and she has DNR status, but apparently she wants everything to be done with the exception of no cardiopulmonary resuscitation.   TIME SPENT: In evaluating this patient and reviewing medical records took more than one hour.  ____________________________ Carney CornersAmir M. Rudene Rearwish, MD amd:slb D: 07/06/2011 00:48:50 ET T: 07/06/2011 09:49:39 ET JOB#: [id number]  cc: Carney CornersAmir M. Rudene Rearwish, MD, <Dictator> Duane LopeJeffrey D. Judithann SheenSparks, MD Zollie ScaleAMIR M Shamel Germond MD ELECTRONICALLY SIGNED 07/06/2011 22:21

## 2014-04-30 NOTE — H&P (Signed)
PATIENT NAME:  Gina Potts, Gina Potts MR#:  [id number] DATE OF BIRTH:  1921/12/29  DATE OF ADMISSION:  03/30/2011  PRIMARY CARE PHYSICIAN: Aram BeechamJeffrey Sparks, MD   HISTORY OF PRESENT ILLNESS: The patient is an 81 year old Caucasian female with past medical history significant for history of oxygen dependent as well as steroid dependent COPD, chronic respiratory failure with remote tobacco abuse who presented to the hospital with complaints of shortness of breath. According to the patient, she was doing well, at least satisfactory, up until yesterday when she started having worsening shortness of breath. She also has been coughing especially in the morning, however, not producing too much fever. She admits of feeling hot and chilly at home but denies any high fevers. She presented to the hospital and because of her significant shortness of breath she was placed on BiPAP here in the Emergency Room. She seemed to be doing a little bit better now after steroids as well as inhalation therapy given in the Emergency Room. Hospitalist was called for admission.   PAST MEDICAL HISTORY:  1. History of chronic respiratory failure on 3 liters of oxygen through nasal cannula at home.  2. History of chronic obstructive pulmonary disease. 3. Remote tobacco abuse, quit in 1984.  4. History of underlying lung cancer. 5. Fibrocystic breast disease. 6. Pulmonary embolism, on Coumadin therapy. 7. Pneumonia. 8. Osteoporosis in the past.  9. L5 compression fracture.  10. Hyperlipidemia.  11. Temporal arteritis. 12. Diabetes mellitus, type II.  13. Osteoarthritis.  14. Atrial fibrillation, chronic.

## 2014-04-30 NOTE — Consult Note (Signed)
Chief Complaint:   Subjective/Chief Complaint Left arm and pelvis fractures.  out of bed in chair and alert.  Less pain.  circulation/sensation/motor function good arm and legs.  hemoglobin level down slightly to 9.0   renal function worsened.  Physical Therapy may try to walk her with a hemiwalker.  will need skilled nursing facility for a short time.  Discussed assisted living with her as well.   VITAL SIGNS/ANCILLARY NOTES: **Vital Signs.:   28-Apr-13 13:35   Vital Signs Type Routine   Temperature Temperature (F) 98   Celsius 36.6   Temperature Source oral   Pulse Pulse 83   Pulse source per Dinamap   Respirations Respirations 20   Systolic BP Systolic BP 324   Diastolic BP (mmHg) Diastolic BP (mmHg) 64   Mean BP 82   Pulse Ox % Pulse Ox % 99   Pulse Ox Activity Level  At rest   Oxygen Delivery 4L   Routine Chem:  28-Apr-13 04:35    Glucose, Serum 160   BUN 35   Creatinine (comp) 1.43   Sodium, Serum 138   Potassium, Serum 4.0   Chloride, Serum 99   CO2, Serum 30   Calcium (Total), Serum 8.3   Osmolality (calc) 287   eGFR (African American) 38   eGFR (Non-African American) 33   Anion Gap 9  Routine Hem:  28-Apr-13 04:35    WBC (CBC) 15.8   RBC (CBC) 3.28   Hemoglobin (CBC) 9.0   Hematocrit (CBC) 27.2   Platelet Count (CBC) 170   MCV 83   MCH 27.4   MCHC 33.1   RDW 15.1  Routine Coag:  28-Apr-13 04:35    Prothrombin 16.7   INR 1.3  Routine Hem:  28-Apr-13 04:35    Neutrophil % 95.6   Lymphocyte % 2.8   Monocyte % 1.5   Eosinophil % 0.0   Basophil % 0.1   Neutrophil # 15.1   Lymphocyte # 0.4   Monocyte # 0.2   Eosinophil # 0.0   Basophil # 0.0  Routine Chem:  28-Apr-13 04:35    Magnesium, Serum 2.5   Electronic Signatures: Park Breed (MD)  (Signed 28-Apr-13 13:39)  Authored: Chief Complaint, VITAL SIGNS/ANCILLARY NOTES, Lab Results   Last Updated: 28-Apr-13 13:39 by Park Breed (MD)

## 2014-04-30 NOTE — Discharge Summary (Signed)
PATIENT NAME:  Gina Potts, Gina Potts MR#:  [id number] DATE OF BIRTH:  [birth year]  DATE OF ADMISSION:  02/14/2011 DATE OF DISCHARGE:  02/18/2011  REASON FOR ADMISSION: Shortness of breath.   HISTORY OF PRESENT ILLNESS: Please see the dictated HPI done by Dr. Enedina FinnerSona Patel on 02/14/2011.   PAST MEDICAL HISTORY:  1. Chronic respiratory failure, on oxygen.  2. Chronic obstructive pulmonary disease.  3. History of lung cancer.  4. Fibrocystic breast disease.  5. History of pulmonary embolism, on anticoagulation.  6. History of pneumonia.  7. Osteoporosis.  8. L5 compression fracture.  9. Hyperlipidemia.  10. History of temporal arteritis.  11. Type 2 diabetes.   MEDICATIONS ON ADMISSION: Please see admission note.   ALLERGIES: Penicillin, mycins, codeine, Valium, sulfa, Dyazide, Vibramycin, Desyrel, Darvocet, Pravachol, Skelaxin, aspirin, Novocain, naproxen, Zetia, Keflex, Vioxx, and Effexor.   SOCIAL HISTORY, FAMILY HISTORY, AND REVIEW OF SYSTEMS: As per admission note.   PHYSICAL EXAM:   GENERAL:  The patient was in no acute distress.  VITAL SIGNS:  Were stable and she was afebrile. HEENT exam was unremarkable.  NECK: Supple without jugular venous distention.   LUNGS: Bilateral wheezes.   CARDIAC: Rapid rate with a regular rhythm. Normal S1 and S2. The abdomen is soft and nontender.   EXTREMITIES: Without edema.   NEUROLOGIC EXAM: Grossly nonfocal.   HOSPITAL COURSE: The patient was admitted with acute on chronic hypoxic hypocarbic respiratory failure secondary to chronic obstructive pulmonary disease exacerbation. Chest x-ray was essentially unchanged. She was placed on IV steroids with IV antibiotics with improvement of her respiratory symptoms. Cultures returned negative. She continued to improve. She was switched to oral antibiotics and oral steroids and returned at her baseline respiratory status. She was stable and ready for discharge on 02/18/2011.   DISCHARGE DIAGNOSES:   1. Acute on chronic respiratory failure.  2. Chronic obstructive pulmonary disease exacerbation.  3. Chronic atrial fibrillation.  4. Underlying lung cancer.  5. Hyperlipidemia.  6. Type 2 diabetes.  7. Osteoarthritis.  8. Osteoporosis.   DISCHARGE MEDICATIONS:  1. Bystolic 5 mg p.o. daily.  2. Lasix 40 mg p.o. daily.  3. Lorazepam 0.5 mg p.o. at bedtime.  4. Tramadol 50 mg p.o. q.6 hours p.r.n. pain.  5. Pantoprazole 40 mg p.o. daily.  6. Fosamax 70 mg p.o. q. week.  7. Coumadin 2.5 mg alternating with 5 mg q.p.m.  8. DuoNeb SVNs q.i.d.  9. Mucinex 600 mg p.o. b.i.d.  10. Serevent 1 puff twice a day.  11. Prednisone taper as directed.  12. Levaquin 500 mg p.o. daily x1 week.   FOLLOWUP PLANS AND APPOINTMENTS: The patient will be discharged with oxygen at 3 liters per minute per nasal cannula. She will be followed by Hospice. She will follow up with me in 1 to 2 weeks, sooner if needed.     ____________________________ Gina LopeJeffrey D. Gina SheenSparks, MD jds:vtd D: 02/28/2011 16:01:32 ET T: 03/01/2011 12:22:49 ET JOB#: [id number]  cc: Gina LopeJeffrey D. Gina SheenSparks, MD, <Dictator> Gina Lekas Rodena Medin Messi Twedt MD ELECTRONICALLY SIGNED 03/02/2011 0:47

## 2014-04-30 NOTE — Discharge Summary (Signed)
PATIENT NAME:  Gina Potts, RETTINGER MR#:  [id number] DATE OF BIRTH:  Nov 18, 1921  DATE OF ADMISSION:  07/06/2011 DATE OF DISCHARGE:  07/09/2011   REASON FOR ADMISSION: Acute respiratory failure.   HISTORY OF PRESENT ILLNESS: The patient is an 81 year old female followed by Hospice with a history of chronic respiratory failure, chronic obstructive pulmonary disease, as well as lung cancer on chronic oxygen therapy who presented to the Emergency Room with worsening of her shortness of breath and cough. In the Emergency Room, the patient was noted to be more hypoxic, tachycardic, and anxious. She was admitted for further evaluation.   PAST MEDICAL HISTORY:  1. Chronic respiratory failure, on home oxygen at 3 liters.  2. Chronic obstructive pulmonary disease.  3. Lung cancer, status post resection. 4. Paroxysmal atrial fibrillation.  5. History of pulmonary embolism.  6. Osteoporosis.  7. Osteoarthritis.  8. Type II diabetes.  9. Hyperlipidemia.  10. L5 compression fracture.  11. Status post right upper lobectomy.   MEDICATIONS ON ADMISSION: Please see admission note.   ALLERGIES: Aspirin, codeine, Darvocet, doxycycline, Keflex, Naprosyn, Novocain, penicillin, sulfa, Valium, and latex.   SOCIAL HISTORY: The patient is widowed. She is from [place]. She quit smoking in 1986. No history of alcohol abuse.   FAMILY HISTORY: Positive for coronary artery disease, hypertension, and laryngeal cancer.   REVIEW OF SYSTEMS: As per history of present illness.   PHYSICAL EXAMINATION: The patient was acutely ill appearing in moderate respiratory distress. Vital signs remarkable for a blood pressure of 182/93 with a heart rate of 140 and a respiratory rate of 38. She was on 100% oxygen and BiPAP at the time. HEENT exam was unremarkable. NECK was supple without JVD. LUNGS revealed rhonchi but no wheezes. CARDIAC exam revealed rapid rate with a regular rhythm. Normal S1 and S2. ABDOMEN soft and nontender.  EXTREMITIES without edema. NEUROLOGIC exam was grossly nonfocal.   HOSPITAL COURSE: The patient was admitted with acute on chronic respiratory failure with chronic obstructive pulmonary disease exacerbation with history of lung cancer. She was initially maintained on 100% oxygen with BiPAP and was easily weaned to nasal cannula oxygen. She was started on IV steroids with antibiotics. She was given Lasix empirically. She was started on SVNs as well. Her symptoms improved. She was followed by Hospice in the hospital. She was NO CODE BLUE, DO NOT RESUSCITATE. She was switched to oral steroids and oral antibiotics with continued SVN therapy with improvement of her symptoms. She could not tolerate physical therapy due to severe deconditioning and dyspnea. She was treated with Roxanol and Ativan for anxiety and discomfort. Placement was recommended and the patient and family agreed. She is now transferred back to the skilled nursing facility for further care and treatment. She is a NO CODE BLUE, DO NOT RESUSCITATE.   DISCHARGE DIAGNOSES:  1. Acute on chronic respiratory failure.  2. Chronic obstructive pulmonary disease exacerbation.  3. Acute bronchitis.  4. Lung cancer, status post right upper lobectomy. 5. Anxiety.  6. Osteoporosis.  7. Osteoarthritis.  8. Paroxysmal atrial fibrillation.  9. History of pulmonary embolism.   DISCHARGE MEDICATIONS:  1. Lopressor 25 mg p.o. b.i.d.  2. DuoNeb SVNs q.i.d.  3. Lasix 40 mg p.o. daily.  4. Klor-Con 20 mEq p.o. daily.  5. Nitro-Dur 0.4 mg topically daily, off at bedtime.  6. DuoNeb SVNs q.2 hours p.r.n. shortness of breath. 7. Ativan 0.5 mg p.o. q.4 hours p.r.n. anxiety.  8. Zofran 4 mg p.o. q.4 hours p.r.n. nausea  and vomiting.  9. Norco 5/325 1 to 2 p.o. q.4 hours p.r.n. pain.  10. Oxygen at 3 liters per minute per nasal cannula.  11. Advair 250/50 one puff b.i.d.  12. Levaquin 250 mg p.o. daily x1 week.  13. Prednisone taper as directed.   14. Spiriva 1 capsule inhaled daily.  15. Singulair 10 mg p.o. daily.  16. Roxanol 5 to 10 mg p.o. q.1 hour p.r.n. pain.   FOLLOW-UP PLANS AND APPOINTMENTS:  1. The patient will be followed by Hospice at the skilled nursing facility.  2. She is on a low sodium diet.  3. She is on 3 liters of oxygen.  4. She is a NO CODE BLUE, DO NOT RESUSCITATE.  5. She will follow-up with me in 1 to 2 weeks in the office, sooner if needed.   ____________________________ Duane LopeJeffrey D. Judithann SheenSparks, MD jds:drc D: 07/09/2011 09:33:23 ET T: 07/09/2011 09:44:12 ET JOB#: [id number]  cc: Duane LopeJeffrey D. Judithann SheenSparks, MD, <Dictator> Avacyn Kloosterman Rodena Medin Abbygail Willhoite MD ELECTRONICALLY SIGNED 07/09/2011 10:07
